# Patient Record
Sex: Female | Born: 1955 | Race: White | Hispanic: No | Marital: Single | Smoking: Former smoker
Health system: Southern US, Community
[De-identification: ages and names within clinical notes are randomized; demographics above are authoritative.]

## PROBLEM LIST (undated history)

## (undated) DIAGNOSIS — E039 Hypothyroidism, unspecified: Secondary | ICD-10-CM

## (undated) DIAGNOSIS — G40909 Epilepsy, unspecified, not intractable, without status epilepticus: Secondary | ICD-10-CM

## (undated) DIAGNOSIS — N390 Urinary tract infection, site not specified: Secondary | ICD-10-CM

## (undated) DIAGNOSIS — F909 Attention-deficit hyperactivity disorder, unspecified type: Secondary | ICD-10-CM

## (undated) DIAGNOSIS — F419 Anxiety disorder, unspecified: Secondary | ICD-10-CM

## (undated) HISTORY — PX: APPENDECTOMY: SHX54

## (undated) HISTORY — PX: NASAL SINUS SURGERY: SHX719

## (undated) HISTORY — PX: BREAST LUMPECTOMY: SHX2

## (undated) HISTORY — PX: BLADDER SURGERY: SHX569

---

## 2006-03-13 ENCOUNTER — Encounter: Admission: RE | Admit: 2006-03-13 | Discharge: 2006-03-13 | Payer: Self-pay | Admitting: Obstetrics and Gynecology

## 2007-04-15 ENCOUNTER — Ambulatory Visit: Payer: Self-pay | Admitting: Internal Medicine

## 2007-04-15 DIAGNOSIS — G253 Myoclonus: Secondary | ICD-10-CM | POA: Insufficient documentation

## 2007-04-15 DIAGNOSIS — E039 Hypothyroidism, unspecified: Secondary | ICD-10-CM | POA: Insufficient documentation

## 2007-04-15 DIAGNOSIS — D803 Selective deficiency of immunoglobulin G [IgG] subclasses: Secondary | ICD-10-CM | POA: Insufficient documentation

## 2007-04-15 DIAGNOSIS — Z8669 Personal history of other diseases of the nervous system and sense organs: Secondary | ICD-10-CM | POA: Insufficient documentation

## 2007-04-15 DIAGNOSIS — F418 Other specified anxiety disorders: Secondary | ICD-10-CM | POA: Insufficient documentation

## 2007-04-16 ENCOUNTER — Telehealth (INDEPENDENT_AMBULATORY_CARE_PROVIDER_SITE_OTHER): Payer: Self-pay | Admitting: *Deleted

## 2007-04-28 LAB — CONVERTED CEMR LAB
Chloride: 104 meq/L (ref 96–112)
Creatinine, Ser: 0.7 mg/dL (ref 0.4–1.2)
Eosinophils Absolute: 0.1 10*3/uL (ref 0.0–0.6)
Eosinophils Relative: 1 % (ref 0.0–5.0)
Glucose, Bld: 88 mg/dL (ref 70–99)
HCT: 41.4 % (ref 36.0–46.0)
Hemoglobin: 14.1 g/dL (ref 12.0–15.0)
MCV: 91.1 fL (ref 78.0–100.0)
Monocytes Absolute: 0.5 10*3/uL (ref 0.2–0.7)
Neutrophils Relative %: 58.8 % (ref 43.0–77.0)
Potassium: 3.8 meq/L (ref 3.5–5.1)
RBC: 4.55 M/uL (ref 3.87–5.11)
RDW: 13.9 % (ref 11.5–14.6)
Sodium: 138 meq/L (ref 135–145)
WBC: 11 10*3/uL — ABNORMAL HIGH (ref 4.5–10.5)

## 2007-05-05 ENCOUNTER — Ambulatory Visit: Payer: Self-pay | Admitting: Internal Medicine

## 2007-05-05 ENCOUNTER — Telehealth (INDEPENDENT_AMBULATORY_CARE_PROVIDER_SITE_OTHER): Payer: Self-pay | Admitting: *Deleted

## 2007-05-23 ENCOUNTER — Encounter (INDEPENDENT_AMBULATORY_CARE_PROVIDER_SITE_OTHER): Payer: Self-pay | Admitting: *Deleted

## 2007-12-01 ENCOUNTER — Telehealth (INDEPENDENT_AMBULATORY_CARE_PROVIDER_SITE_OTHER): Payer: Self-pay | Admitting: *Deleted

## 2008-01-30 ENCOUNTER — Ambulatory Visit: Payer: Self-pay | Admitting: Internal Medicine

## 2008-01-30 DIAGNOSIS — J329 Chronic sinusitis, unspecified: Secondary | ICD-10-CM | POA: Insufficient documentation

## 2008-02-03 LAB — CONVERTED CEMR LAB
Basophils Absolute: 0.1 10*3/uL (ref 0.0–0.1)
Eosinophils Absolute: 0.2 10*3/uL (ref 0.0–0.7)
HCT: 42 % (ref 36.0–46.0)
Hemoglobin: 14.7 g/dL (ref 12.0–15.0)
MCHC: 34.9 g/dL (ref 30.0–36.0)
Monocytes Absolute: 0.5 10*3/uL (ref 0.1–1.0)
Neutro Abs: 4 10*3/uL (ref 1.4–7.7)
RDW: 12.8 % (ref 11.5–14.6)
TSH: 1.59 microintl units/mL (ref 0.35–5.50)

## 2008-02-05 ENCOUNTER — Encounter (INDEPENDENT_AMBULATORY_CARE_PROVIDER_SITE_OTHER): Payer: Self-pay | Admitting: *Deleted

## 2008-02-12 ENCOUNTER — Encounter (INDEPENDENT_AMBULATORY_CARE_PROVIDER_SITE_OTHER): Payer: Self-pay | Admitting: *Deleted

## 2008-02-26 ENCOUNTER — Encounter: Admission: RE | Admit: 2008-02-26 | Discharge: 2008-02-26 | Payer: Self-pay | Admitting: Orthopedic Surgery

## 2008-05-17 ENCOUNTER — Telehealth (INDEPENDENT_AMBULATORY_CARE_PROVIDER_SITE_OTHER): Payer: Self-pay | Admitting: *Deleted

## 2008-06-08 ENCOUNTER — Telehealth (INDEPENDENT_AMBULATORY_CARE_PROVIDER_SITE_OTHER): Payer: Self-pay | Admitting: *Deleted

## 2008-06-14 ENCOUNTER — Telehealth (INDEPENDENT_AMBULATORY_CARE_PROVIDER_SITE_OTHER): Payer: Self-pay | Admitting: *Deleted

## 2008-11-16 ENCOUNTER — Ambulatory Visit: Payer: Self-pay | Admitting: Internal Medicine

## 2008-11-16 DIAGNOSIS — R42 Dizziness and giddiness: Secondary | ICD-10-CM

## 2008-11-16 LAB — CONVERTED CEMR LAB
BUN: 22 mg/dL (ref 6–23)
Basophils Absolute: 0 10*3/uL (ref 0.0–0.1)
Basophils Relative: 0 % (ref 0.0–3.0)
CO2: 28 meq/L (ref 19–32)
Calcium: 9.4 mg/dL (ref 8.4–10.5)
Chloride: 104 meq/L (ref 96–112)
Creatinine, Ser: 0.7 mg/dL (ref 0.4–1.2)
Eosinophils Absolute: 0.3 10*3/uL (ref 0.0–0.7)
Eosinophils Relative: 3.4 % (ref 0.0–5.0)
GFR calc non Af Amer: 93.01 mL/min (ref >60)
Glucose, Bld: 86 mg/dL (ref 70–99)
HCT: 41.1 % (ref 36.0–46.0)
Hemoglobin: 14 g/dL (ref 12.0–15.0)
Lymphocytes Relative: 38.2 % (ref 12.0–46.0)
Lymphs Abs: 3.4 10*3/uL (ref 0.7–4.0)
MCHC: 34.2 g/dL (ref 30.0–36.0)
MCV: 91.7 fL (ref 78.0–100.0)
Monocytes Absolute: 0.1 10*3/uL (ref 0.1–1.0)
Monocytes Relative: 1.5 % — ABNORMAL LOW (ref 3.0–12.0)
Neutro Abs: 5.2 10*3/uL (ref 1.4–7.7)
Neutrophils Relative %: 56.9 % (ref 43.0–77.0)
Platelets: 235 10*3/uL (ref 150.0–400.0)
Potassium: 4.5 meq/L (ref 3.5–5.1)
RBC: 4.48 M/uL (ref 3.87–5.11)
RDW: 13.4 % (ref 11.5–14.6)
Sodium: 140 meq/L (ref 135–145)
TSH: 0.85 microintl units/mL (ref 0.35–5.50)
WBC: 9 10*3/uL (ref 4.5–10.5)

## 2008-11-18 ENCOUNTER — Ambulatory Visit: Payer: Self-pay | Admitting: Internal Medicine

## 2008-11-18 ENCOUNTER — Ambulatory Visit: Payer: Self-pay | Admitting: Diagnostic Radiology

## 2008-11-18 ENCOUNTER — Telehealth (INDEPENDENT_AMBULATORY_CARE_PROVIDER_SITE_OTHER): Payer: Self-pay | Admitting: *Deleted

## 2008-11-18 ENCOUNTER — Ambulatory Visit (HOSPITAL_BASED_OUTPATIENT_CLINIC_OR_DEPARTMENT_OTHER): Admission: RE | Admit: 2008-11-18 | Discharge: 2008-11-18 | Payer: Self-pay | Admitting: Certified Registered"

## 2008-11-18 ENCOUNTER — Ambulatory Visit: Payer: Self-pay | Admitting: Interventional Radiology

## 2008-11-26 ENCOUNTER — Telehealth: Payer: Self-pay | Admitting: Internal Medicine

## 2009-10-17 ENCOUNTER — Telehealth (INDEPENDENT_AMBULATORY_CARE_PROVIDER_SITE_OTHER): Payer: Self-pay | Admitting: *Deleted

## 2009-11-09 ENCOUNTER — Ambulatory Visit: Payer: Self-pay | Admitting: Internal Medicine

## 2009-11-10 ENCOUNTER — Encounter (INDEPENDENT_AMBULATORY_CARE_PROVIDER_SITE_OTHER): Payer: Self-pay | Admitting: *Deleted

## 2009-11-11 ENCOUNTER — Ambulatory Visit: Payer: Self-pay | Admitting: Internal Medicine

## 2009-11-16 LAB — CONVERTED CEMR LAB
ALT: 16 units/L (ref 0–35)
Basophils Relative: 0.7 % (ref 0.0–3.0)
CO2: 29 meq/L (ref 19–32)
Calcium: 9.5 mg/dL (ref 8.4–10.5)
Eosinophils Relative: 1.9 % (ref 0.0–5.0)
HDL: 92 mg/dL (ref >39.00)
Hemoglobin: 13.9 g/dL (ref 12.0–15.0)
Lymphocytes Relative: 40.2 % (ref 12.0–46.0)
MCHC: 34 g/dL (ref 30.0–36.0)
Monocytes Relative: 6.2 % (ref 3.0–12.0)
Neutro Abs: 4.3 10*3/uL (ref 1.4–7.7)
RBC: 4.43 M/uL (ref 3.87–5.11)
Sodium: 141 meq/L (ref 135–145)
Total CHOL/HDL Ratio: 3
Triglycerides: 69 mg/dL (ref 0.0–149.0)
VLDL: 13.8 mg/dL (ref 0.0–40.0)

## 2010-02-03 ENCOUNTER — Telehealth (INDEPENDENT_AMBULATORY_CARE_PROVIDER_SITE_OTHER): Payer: Self-pay | Admitting: *Deleted

## 2010-06-18 ENCOUNTER — Encounter: Payer: Self-pay | Admitting: Internal Medicine

## 2010-06-18 ENCOUNTER — Encounter: Payer: Self-pay | Admitting: Emergency Medicine

## 2010-06-19 ENCOUNTER — Encounter: Payer: Self-pay | Admitting: Internal Medicine

## 2010-06-27 NOTE — Progress Notes (Signed)
Summary: pt retd call -cpx scheduled [id number]  Phone Note Outgoing Call Call back at [facility] Phone [phone] Call back at Work Phone [phone]   Summary of Call: Due cpx/labs.......Marland KitchenShary Decamp  Oct 17, 2009 10:00 AM   Follow-up for Phone Call        [email or web address] Barban  Oct 17, 2009 10:05 AM  Additional Follow-up for Phone Call Additional follow up Details #1::        patient returned call appt scheduled [id number] Additional Follow-up by: Okey Regal Spring,  Oct 17, 2009 11:56 AM

## 2010-06-27 NOTE — Letter (Signed)
Summary: Previsit letter  [facility]  [address] [place], [place] [address]   Phone: [phone]  Fax: [phone]       11/10/2009 MRN: [id number]  [facility] [address] [place], [place]  [address]  Dear Ms. Dareen Piano,  Welcome to the Gastroenterology Division at [facility].    You are scheduled to see a nurse for your pre-procedure visit on 11-25-09 at 1pm on the 3rd floor at [facility], [address]  We ask that you try to arrive at our office 15 minutes prior to your appointment time to allow for check-in.  Your nurse visit will consist of discussing your medical and surgical history, your immediate family medical history, and your medications.    Please bring a complete list of all your medications or, if you prefer, bring the medication bottles and we will list them.  We will need to be aware of both prescribed and over the counter drugs.  We will need to know exact dosage information as well.  If you are on blood thinners (Coumadin, Plavix, Aggrenox, Ticlid, etc.) please call our office today/prior to your appointment, as we need to consult with your physician about holding your medication.   Please be prepared to read and sign documents such as consent forms, a financial agreement, and acknowledgement forms.  If necessary, and with your consent, a friend or relative is welcome to sit-in on the nurse visit with you.  Please bring your insurance card so that we may make a copy of it.  If your insurance requires a referral to see a specialist, please bring your referral form from your primary care physician.  No co-pay is required for this nurse visit.     If you cannot keep your appointment, please call [phone] to cancel or reschedule prior to your appointment date.  This allows [place] the opportunity to schedule an appointment for another patient in need of care.    Thank you for choosing [facility] for your medical  needs.  We appreciate the opportunity to care for you.  Please visit [place] at our website  to learn more about our practice.                     Sincerely.                                                                                                                   The Gastroenterology Division

## 2010-06-27 NOTE — Assessment & Plan Note (Signed)
Summary: cpx/cbs   Vital Signs:  Patient profile:   55 year old female Height:      63 inches Weight:      128 pounds BMI:     22.76 Temp:     98.9 degrees F oral Pulse rate:   82 / minute Resp:     20 per minute BP sitting:   130 / 86  (left arm)  Vitals Entered By: Jeremy Johann CMA (November 09, 2009 2:48 PM) CC: CPX Comments --NOT FASTING --REFILL REVIEWED MED LIST, PATIENT AGREED DOSE AND INSTRUCTION CORRECT    History of Present Illness: CPX -status post  root canal  x 2  complicated by an abscess, she has seen the dentist several times in the last few months, she has taken several antibiotics -she met with Dr. Craige Cotta, a neurologist, she was seen with apparently headache and dizziness. She got a  local injection  in the back of the neck as well as Topamax which she couldn't tolerate -she has seen psychiatrists at [facility], she was put on Celexa which helps with depression. She was diagnosed with ADHD, prescribed Adderall. She thinks Adderall is not  helping at all.   Allergies: 1)  ! Sulfa 2)  ! Pcn 3)  ! * Maxiphen  Past History:  Past Medical History: G1 miscarriage x 1  Hypothyroidism Depression ADHD? (was eval at [facility] 2011) IMMUNOGLOBULIN DEFICIENCIES  .Marland Kitchen. sees ID in [place] h/o SZ d/o and Myoclonus ...sees neurology in [facility] (Dr Craige Cotta)  Past Surgical History: Reviewed history from 04/15/2007 and no changes required. 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 CV:  Denies chest pain or  discomfort and swelling of feet. Resp:  Denies cough and shortness of breath. GI:  Denies bloody stools, nausea, and vomiting. GU:  Denies discharge and dysuria.  Physical Exam  General:  alert, well-developed, and well-nourished.   Neck:  no masses and no thyromegaly.   Lungs:  normal respiratory effort, no intercostal retractions, no accessory muscle use, and normal breath sounds.   Heart:  normal rate, regular rhythm, no murmur, and no gallop.   Abdomen:  soft, non-tender, no distention, no masses, no guarding, and no rigidity.   Extremities:  no edema Neurologic:  alert & oriented X3, cranial nerves II-XII intact, strength normal in all extremities, and gait normal.   Psych:  Cognition and judgment appear intact. Alert and cooperative with normal attention span and concentration. not anxious appearing and not depressed appearing.     Impression & Recommendations:  Problem # 1:  HEALTH SCREENING (ICD-V70.0) Td 2008 EKG-- baseline, NSR has not seen gyn in a while , recommend to schedule her appointment soon as possible mammogram 10/2008, negative. Ordering one several Cscopes before, last > 10 years per patient . She has a family history colon cancer, referred to GI  labs Encouraged to continue with her healthy lifestyle  Orders: Radiology Referral (Radiology) EKG w/ Interpretation (93000) Gastroenterology Referral (GI)  Problem #  2:  HYPOTHYROIDISM (ICD-244.9) labs RF Her updated medication list for this problem includes:    Synthroid 100 Mcg Tabs (Levothyroxine sodium) ..... Once daily - due office visit  Labs Reviewed: TSH: 0.85 (11/16/2008)     Problem # 3:  DIZZINESS (ICD-780.4) see history of present illness The following medications were removed from the medication list:    Meclizine Hcl 12.5 Mg Tabs (Meclizine hcl) ..... One p.o. every 6 hours p.r.n. dizziness  Problem # 4:  DEPRESSION (ICD-311) see history of present illness Her updated medication list for  this problem includes:    Celexa 20 Mg Tabs (Citalopram hydrobromide) .Marland Kitchen... Take 1 tab once daily    Clonazepam 1 Mg Tabs (Clonazepam) ..... Once daily  Complete Medication List: 1)  Celexa 20 Mg Tabs (Citalopram hydrobromide) .... Take 1 tab once daily 2)  Clonazepam 1 Mg Tabs (Clonazepam) .... Once daily 3)  Synthroid 100 Mcg Tabs (Levothyroxine sodium) .... Once daily - due office visit  Patient Instructions: 1)  come back fasting: 2)   BMP, CBC, TSH,FLP, AST, ALT   dx v70 3)  Please schedule a follow-up appointment in 1 year.  Prescriptions: SYNTHROID 100 MCG  TABS (LEVOTHYROXINE SODIUM) once daily - DUE OFFICE VISIT  #30 x 6   Entered and Authorized by:   Nolon Rod. Beth Goodlin MD   Signed by:   Nolon Rod. Voyd Groft MD on 11/09/2009   Method used:   Electronically to        [facility] [phone]* (retail)       [address]       [place], [place]  [address]       Ph: [phone]       Fax: [phone]   RxID:   [id number]

## 2010-06-27 NOTE — Progress Notes (Signed)
Summary: refill  Phone Note Refill Request Message from:  Fax from Pharmacy on February 03, 2010 10:04 AM  Refills Requested: Medication #1:  SYNTHROID 100 MCG  TABS once daily - DUE OFFICE VISIT. Vickey Sages - fax [phone]  Initial call taken by: Okey Regal Spring,  February 03, 2010 10:04 AM    Prescriptions: SYNTHROID 100 MCG  TABS (LEVOTHYROXINE SODIUM) once daily - DUE OFFICE VISIT  #90 x 1   Entered by:   Army Fossa CMA   Authorized by:   Nolon Rod. Paz MD   Signed by:   Army Fossa CMA on 02/03/2010   Method used:   Faxed to ...       [facility] (mail-order)             , [place]         Ph: [phone]       Fax: [phone]   RxID:   [id number]

## 2010-07-14 ENCOUNTER — Ambulatory Visit (INDEPENDENT_AMBULATORY_CARE_PROVIDER_SITE_OTHER): Payer: BC Managed Care – PPO | Admitting: Psychiatry

## 2010-07-14 ENCOUNTER — Ambulatory Visit (HOSPITAL_COMMUNITY): Payer: Self-pay | Admitting: Psychiatry

## 2010-07-14 DIAGNOSIS — F411 Generalized anxiety disorder: Secondary | ICD-10-CM

## 2010-07-14 DIAGNOSIS — F339 Major depressive disorder, recurrent, unspecified: Secondary | ICD-10-CM

## 2010-07-21 ENCOUNTER — Ambulatory Visit (HOSPITAL_COMMUNITY): Payer: Self-pay | Admitting: Psychiatry

## 2010-07-26 ENCOUNTER — Ambulatory Visit (HOSPITAL_COMMUNITY): Payer: BC Managed Care – PPO | Admitting: Psychology

## 2010-09-12 ENCOUNTER — Encounter (INDEPENDENT_AMBULATORY_CARE_PROVIDER_SITE_OTHER): Payer: BLUE CROSS/BLUE SHIELD | Admitting: Psychiatry

## 2010-09-12 DIAGNOSIS — F411 Generalized anxiety disorder: Secondary | ICD-10-CM

## 2010-10-25 ENCOUNTER — Encounter (INDEPENDENT_AMBULATORY_CARE_PROVIDER_SITE_OTHER): Payer: BLUE CROSS/BLUE SHIELD | Admitting: Psychiatry

## 2010-10-25 DIAGNOSIS — F339 Major depressive disorder, recurrent, unspecified: Secondary | ICD-10-CM

## 2010-11-07 ENCOUNTER — Encounter: Payer: Self-pay | Admitting: Emergency Medicine

## 2010-11-07 ENCOUNTER — Inpatient Hospital Stay (INDEPENDENT_AMBULATORY_CARE_PROVIDER_SITE_OTHER)
Admission: RE | Admit: 2010-11-07 | Discharge: 2010-11-07 | Disposition: A | Discharge status: Home / Self Care | Payer: BLUE CROSS/BLUE SHIELD | Source: Ambulatory Visit | Attending: Emergency Medicine | Admitting: Emergency Medicine

## 2010-11-07 ENCOUNTER — Ambulatory Visit (INDEPENDENT_AMBULATORY_CARE_PROVIDER_SITE_OTHER): Payer: BLUE CROSS/BLUE SHIELD | Admitting: Psychology

## 2010-11-07 DIAGNOSIS — F321 Major depressive disorder, single episode, moderate: Secondary | ICD-10-CM

## 2010-11-07 DIAGNOSIS — N390 Urinary tract infection, site not specified: Secondary | ICD-10-CM

## 2010-11-07 LAB — CONVERTED CEMR LAB
Glucose, Urine, Semiquant: NEGATIVE
Nitrite: NEGATIVE
Specific Gravity, Urine: >=1.03
pH: 6.5

## 2010-11-10 ENCOUNTER — Telehealth (INDEPENDENT_AMBULATORY_CARE_PROVIDER_SITE_OTHER): Payer: Self-pay | Admitting: Emergency Medicine

## 2010-11-12 ENCOUNTER — Telehealth (INDEPENDENT_AMBULATORY_CARE_PROVIDER_SITE_OTHER): Payer: Self-pay

## 2010-11-21 ENCOUNTER — Encounter (INDEPENDENT_AMBULATORY_CARE_PROVIDER_SITE_OTHER): Payer: BC Managed Care – PPO | Admitting: Psychology

## 2010-11-21 ENCOUNTER — Encounter: Payer: Self-pay | Admitting: Family Medicine

## 2010-11-21 ENCOUNTER — Inpatient Hospital Stay (INDEPENDENT_AMBULATORY_CARE_PROVIDER_SITE_OTHER)
Admission: RE | Admit: 2010-11-21 | Discharge: 2010-11-21 | Disposition: A | Discharge status: Home / Self Care | Payer: BLUE CROSS/BLUE SHIELD | Source: Ambulatory Visit | Attending: Family Medicine | Admitting: Family Medicine

## 2010-11-21 DIAGNOSIS — F411 Generalized anxiety disorder: Secondary | ICD-10-CM

## 2010-11-21 DIAGNOSIS — N390 Urinary tract infection, site not specified: Secondary | ICD-10-CM

## 2010-11-21 LAB — CONVERTED CEMR LAB
Bilirubin Urine: NEGATIVE
Blood in Urine, dipstick: NEGATIVE
Nitrite: NEGATIVE
Specific Gravity, Urine: 1.025
Urobilinogen, UA: 0.2
pH: 6

## 2010-12-01 ENCOUNTER — Ambulatory Visit (INDEPENDENT_AMBULATORY_CARE_PROVIDER_SITE_OTHER): Payer: BLUE CROSS/BLUE SHIELD | Admitting: Family Medicine

## 2010-12-01 ENCOUNTER — Encounter: Payer: Self-pay | Admitting: Family Medicine

## 2010-12-01 DIAGNOSIS — Z23 Encounter for immunization: Secondary | ICD-10-CM

## 2010-12-01 DIAGNOSIS — G43109 Migraine with aura, not intractable, without status migrainosus: Secondary | ICD-10-CM | POA: Insufficient documentation

## 2010-12-01 DIAGNOSIS — I341 Nonrheumatic mitral (valve) prolapse: Secondary | ICD-10-CM | POA: Insufficient documentation

## 2010-12-01 DIAGNOSIS — N390 Urinary tract infection, site not specified: Secondary | ICD-10-CM

## 2010-12-01 DIAGNOSIS — F329 Major depressive disorder, single episode, unspecified: Secondary | ICD-10-CM

## 2010-12-01 DIAGNOSIS — I059 Rheumatic mitral valve disease, unspecified: Secondary | ICD-10-CM

## 2010-12-01 MED ORDER — ESTROGENS, CONJUGATED 0.625 MG/GM VA CREA: TOPICAL_CREAM | VAGINAL | Status: DC

## 2010-12-01 MED ORDER — ZOSTER VACCINE LIVE 19400 UNT/0.65ML ~~LOC~~ SOLR
0.6500 mL | Freq: Once | SUBCUTANEOUS | Status: AC
  Administered 2010-12-01: 19400 [IU] via SUBCUTANEOUS

## 2010-12-01 MED ORDER — NITROFURANTOIN MACROCRYSTAL 100 MG PO CAPS: 100.0000 mg | ORAL_CAPSULE | Freq: Every day | ORAL | Status: AC

## 2010-12-01 NOTE — Progress Notes (Signed)
Subjective:    Patient ID: Diana Porter, female    DOB: 07-13-1955, 55 y.o.   MRN: [id number]  HPI Had 3 bladder infection around Feb, and before that hadn't had any infections since 2007.  Went to Dr. Sabino Gasser at [facility] and had cystoscopy and CT with contrast because was having so much pain in her bladder.  Culture grew out e coli. Can take several weeks once treated infection for UTI. Used to be on prophylactic levaquin years ago before her bladder surgery.  With the last infection took cipro for 2 week. Went on Roy again and started feeling better again.  Went back to Dr. Sabino Gasser about a week ago. Having hematuria. Hx of Interstitial cystitis. She reports a hx of bladder problems since age 16.  Just completed recent ABX for sinusitis.     Review of Systems  Constitutional: Negative for fever, diaphoresis and unexpected weight change.  HENT: Negative for hearing loss, rhinorrhea and tinnitus.   Eyes: Negative for visual disturbance.  Respiratory: Negative for cough and wheezing.   Cardiovascular: Negative for chest pain and palpitations.  Gastrointestinal: Negative for nausea, vomiting, diarrhea and blood in stool.  Genitourinary: Negative for vaginal bleeding, vaginal discharge and difficulty urinating.  Musculoskeletal: Negative for myalgias and arthralgias.  Skin: Negative for rash.  Neurological: Positive for headaches.  Hematological: Negative for adenopathy. Does not bruise/bleed easily.  Psychiatric/Behavioral: Negative for sleep disturbance and dysphoric mood. The patient is not nervous/anxious.      BP 120/70  Pulse 90  Temp(Src) 98.7 F (37.1 C) (Oral)  Ht 5\' 3"  (1.6 m)  Wt 134 lb (60.782 kg)  BMI 23.74 kg/m2  SpO2 96%    Allergies  Allergen Reactions  . Penicillins   . Sulfonamide Derivatives     No past medical history on file.  Past Surgical History  Procedure Date  . Appendectomy   . Breast lumpectomy     x 2  . Bladder surgery     x 2    . Nasal sinus surgery     History   Social History  . Marital Status: Single    Spouse Name: N/A    Number of Children: N/A  . Years of Education: N/A   Occupational History  . Not on file.   Social History Main Topics  . Smoking status: Former Smoker -- 0.3 packs/day    Types: Cigarettes    Quit date: 05/28/2008  . Smokeless tobacco: Not on file  . Alcohol Use: Yes  . Drug Use: No  . Sexually Active: Not on file   Other Topics Concern  . Not on file   Social History Narrative   Used to do consulting work.  Unemployed. 2 caffeine drinks per day.     Family History  Problem Relation Age of Onset  . Breast cancer Mother 46    metasticized   . Prostate cancer Father   . Prostate cancer Brother 43  . Skin cancer Brother     Current outpatient prescriptions:cefdinir (OMNICEF) 300 MG capsule, Take 300 mg by mouth 2 (two) times daily.  , Disp: , Rfl: ;  citalopram (CELEXA) 20 MG tablet, Take 40 mg by mouth daily.  , Disp: , Rfl: ;  clonazePAM (KLONOPIN) 1 MG tablet, Take 1 mg by mouth 2 (two) times daily as needed.  , Disp: , Rfl: ;  levothyroxine (SYNTHROID, LEVOTHROID) 100 MCG tablet, Take 100 mcg by mouth daily.  , Disp: , Rfl:  topiramate (TOPAMAX) 50  MG tablet, Take 50 mg by mouth 2 (two) times daily.  , Disp: , Rfl: ;  traMADol (ULTRAM) 50 MG tablet, Take 50 mg by mouth 2 (two) times daily as needed.  , Disp: , Rfl: ;  conjugated estrogens (PREMARIN) vaginal cream, Place vaginally once a week., Disp: 42.5 g, Rfl: 6;  nitrofurantoin (MACRODANTIN) 100 MG capsule, Take 1 capsule (100 mg total) by mouth daily., Disp: 30 capsule, Rfl: 6     Objective:   Physical Exam  Constitutional: She is oriented to person, place, and time. She appears well-developed and well-nourished.  HENT:  Head: Normocephalic and atraumatic.  Cardiovascular: Normal rate, regular rhythm and normal heart sounds.   Pulmonary/Chest: Effort normal and breath sounds normal.  Neurological: She is alert  and oriented to person, place, and time.  Skin: Skin is warm and dry.  Psychiatric: She has a normal mood and affect.          Assessment & Plan:

## 2010-12-01 NOTE — Assessment & Plan Note (Signed)
Discussed trial of prophylaxis with nitrofurantoin. She is also 10 years post menopuasal so recommend trial of estrogen cream as well. F/U in 3 mo.

## 2010-12-01 NOTE — Progress Notes (Signed)
Addended by: Gifford Shave on: 12/01/2010 05:17 PM   Modules accepted: Orders

## 2010-12-06 ENCOUNTER — Encounter (INDEPENDENT_AMBULATORY_CARE_PROVIDER_SITE_OTHER): Payer: BLUE CROSS/BLUE SHIELD | Admitting: Psychiatry

## 2010-12-06 DIAGNOSIS — F339 Major depressive disorder, recurrent, unspecified: Secondary | ICD-10-CM

## 2010-12-06 DIAGNOSIS — F411 Generalized anxiety disorder: Secondary | ICD-10-CM

## 2010-12-20 ENCOUNTER — Encounter: Payer: BLUE CROSS/BLUE SHIELD | Admitting: Obstetrics & Gynecology

## 2011-02-06 ENCOUNTER — Encounter (INDEPENDENT_AMBULATORY_CARE_PROVIDER_SITE_OTHER): Payer: BC Managed Care – PPO | Admitting: Psychiatry

## 2011-02-06 DIAGNOSIS — F339 Major depressive disorder, recurrent, unspecified: Secondary | ICD-10-CM

## 2011-03-06 ENCOUNTER — Telehealth: Payer: Self-pay | Admitting: *Deleted

## 2011-03-06 MED ORDER — LEVOTHYROXINE SODIUM 100 MCG PO TABS: 100.0000 ug | ORAL_TABLET | Freq: Every day | ORAL | Status: DC

## 2011-03-06 NOTE — Telephone Encounter (Signed)
Request for Synthroid 100 mcg [last refill 07/10/10 Last OV 11/09/2009].

## 2011-03-06 NOTE — Telephone Encounter (Signed)
Will forward to PCP 

## 2011-03-06 NOTE — Telephone Encounter (Signed)
Call pt. Med sent

## 2011-03-07 NOTE — Telephone Encounter (Signed)
Pt notified. KJ LPN 

## 2011-03-12 ENCOUNTER — Other Ambulatory Visit: Payer: Self-pay | Admitting: Family Medicine

## 2011-03-12 MED ORDER — LEVOTHYROXINE SODIUM 100 MCG PO TABS: 100.0000 ug | ORAL_TABLET | Freq: Every day | ORAL | Status: DC

## 2011-03-12 NOTE — Telephone Encounter (Signed)
Done

## 2011-04-06 ENCOUNTER — Emergency Department
Admission: EM | Admit: 2011-04-06 | Discharge: 2011-04-06 | Disposition: A | Discharge status: Home / Self Care | Payer: BC Managed Care – PPO | Source: Home / Self Care | Attending: Family Medicine | Admitting: Family Medicine

## 2011-04-06 ENCOUNTER — Encounter: Payer: Self-pay | Admitting: *Deleted

## 2011-04-06 DIAGNOSIS — J329 Chronic sinusitis, unspecified: Secondary | ICD-10-CM

## 2011-04-06 DIAGNOSIS — J019 Acute sinusitis, unspecified: Secondary | ICD-10-CM

## 2011-04-06 HISTORY — DX: Epilepsy, unspecified, not intractable, without status epilepticus: G40.909

## 2011-04-06 HISTORY — DX: Anxiety disorder, unspecified: F41.9

## 2011-04-06 HISTORY — DX: Urinary tract infection, site not specified: N39.0

## 2011-04-06 HISTORY — DX: Attention-deficit hyperactivity disorder, unspecified type: F90.9

## 2011-04-06 HISTORY — DX: Hypothyroidism, unspecified: E03.9

## 2011-04-06 MED ORDER — CEFTRIAXONE SODIUM 1 G IJ SOLR
1.0000 g | Freq: Once | INTRAMUSCULAR | Status: AC
  Administered 2011-04-06: 1 g via INTRAMUSCULAR

## 2011-04-06 MED ORDER — LEVOFLOXACIN 750 MG PO TABS: 750.0000 mg | ORAL_TABLET | Freq: Every day | ORAL | Status: AC

## 2011-04-06 MED ORDER — METHYLPREDNISOLONE SODIUM SUCC 125 MG IJ SOLR
125.0000 mg | Freq: Once | INTRAMUSCULAR | Status: AC
  Administered 2011-04-06: 125 mg via INTRAMUSCULAR

## 2011-04-06 MED ORDER — FLUTICASONE PROPIONATE 50 MCG/ACT NA SUSP: 2.0000 | Freq: Every day | NASAL | Status: DC

## 2011-04-06 MED ORDER — FEXOFENADINE-PSEUDOEPHED ER 180-240 MG PO TB24: 1.0000 | ORAL_TABLET | Freq: Every day | ORAL | Status: DC

## 2011-04-06 NOTE — ED Notes (Signed)
Pt c/o sinus pain and pressure, and bilateral ear ache x 1 mth. She states that she "feels bad". She saw Dr Craige Cotta her neurologist on 03/19/11 and was dx with sinusitis and prescribed Cefnidir 300 mg 1 po BID x 10 days and Prednisone with no relief.

## 2011-04-06 NOTE — Discharge Instructions (Signed)

## 2011-04-06 NOTE — ED Provider Notes (Signed)
History     CSN: [id number] Arrival date & time: 04/06/2011 11:02 AM   First MD Initiated Contact with Patient 04/06/11 1102      Chief Complaint  Patient presents with  . Sinusitis    (Consider location/radiation/quality/duration/timing/severity/associated sxs/prior treatment) Patient is a 56 y.o. female presenting with sinusitis.  Sinusitis    Patient's here because of sinus pressure she reports having sinus trouble for over a month she's had 2 previous sinus surgeries. Saw her neurologist who place [place] Omnicef 300 twice a day for 10 days and placed on prednisone several days. Despite these 2 medications she still having trouble and symptoms of sinusitis infection. Patient states she felt miserable can get in to see her PCP today so she came to urgent care to be seen. Past Medical History  Diagnosis Date  . Hypothyroidism   . Chronic UTI   . Epilepsy   . ADD (attention deficit disorder with hyperactivity)   . Anxiety     Past Surgical History  Procedure Date  . Appendectomy   . Breast lumpectomy     x 2  . Bladder surgery     x 2   . Nasal sinus surgery     Family History  Problem Relation Age of Onset  . Breast cancer Mother 17    metasticized   . Prostate cancer Father   . Heart failure Father   . Prostate cancer Brother 39  . Lymphoma Brother   . Skin cancer Brother     History  Substance Use Topics  . Smoking status: Former Smoker -- 0.3 packs/day    Types: Cigarettes    Quit date: 05/28/2008  . Smokeless tobacco: Not on file  . Alcohol Use: Yes     3-5 per wk    OB History    Grav Para Term Preterm Abortions TAB SAB Ect Mult Living                  Review of Systems  Allergies  Guaifenesin & derivatives; Latex; Penicillins; and Sulfonamide derivatives  Home Medications   Current Outpatient Rx  Name Route Sig Dispense Refill  . NITROFURANTOIN MACROCRYSTAL 100 MG PO CAPS Oral Take 100 mg by mouth at bedtime.      Marland Kitchen CEFDINIR 300 MG PO CAPS  Oral Take 300 mg by mouth 2 (two) times daily.      Marland Kitchen CITALOPRAM HYDROBROMIDE 20 MG PO TABS Oral Take 40 mg by mouth daily.      Marland Kitchen CLONAZEPAM 1 MG PO TABS Oral Take 1 mg by mouth 2 (two) times daily as needed.      Marland Kitchen ESTROGENS, CONJUGATED 0.625 MG/GM VA CREA Vaginal Place vaginally once a week. 42.5 g 6  . LEVOTHYROXINE SODIUM 100 MCG PO TABS Oral Take 1 tablet (100 mcg total) by mouth daily. 90 tablet 0  . TOPIRAMATE 50 MG PO TABS Oral Take 50 mg by mouth 2 (two) times daily.      . TRAMADOL HCL 50 MG PO TABS Oral Take 50 mg by mouth 2 (two) times daily as needed.        BP 112/75  Pulse 89  Temp(Src) 99 F (37.2 C) (Oral)  Resp 18  Ht 5' 3.5" (1.613 m)  Wt 136 lb 8 oz (61.916 kg)  BMI 23.80 kg/m2  SpO2 98%  Physical Exam  Constitutional: She is oriented to person, place, and time. She appears well-developed and well-nourished.  HENT:  Head: Normocephalic.  Right Ear:  Hearing, tympanic membrane, external ear and ear canal normal.  Left Ear: Hearing, tympanic membrane and ear canal normal.  Nose: Mucosal edema present. No rhinorrhea. Right sinus exhibits maxillary sinus tenderness. Left sinus exhibits maxillary sinus tenderness.  Mouth/Throat: Uvula is midline, oropharynx is clear and moist and mucous membranes are normal. Normal dentition. No dental caries.       Tenderness over R eustachian tube  Neck: Trachea normal and normal range of motion. Neck supple.  Neurological: She is alert and oriented to person, place, and time.  Skin: Skin is warm and dry.  Psychiatric: She has a normal mood and affect. Her behavior is normal.    ED Course  Procedures (including critical care time) 6 since patient has a penicillin allergy we'll place her on Levaquin 750 one tablet a day for at least 10 days #2 Allegra-D 1 tablet 24 hours daily #3 Flonase nasal spray went over how to use the steroid nasal spray and follow up with ENT if is not better in 10-14 days.       MDM           Hassan Rowan, MD 04/06/11 1556

## 2011-04-16 ENCOUNTER — Ambulatory Visit (INDEPENDENT_AMBULATORY_CARE_PROVIDER_SITE_OTHER): Payer: BC Managed Care – PPO | Admitting: Family Medicine

## 2011-04-16 ENCOUNTER — Encounter: Payer: Self-pay | Admitting: Family Medicine

## 2011-04-16 VITALS — BP 125/72 | HR 92 | Temp 99.1°F | Wt 139.0 lb

## 2011-04-16 DIAGNOSIS — D803 Selective deficiency of immunoglobulin G [IgG] subclasses: Secondary | ICD-10-CM

## 2011-04-16 DIAGNOSIS — R894 Abnormal immunological findings in specimens from other organs, systems and tissues: Secondary | ICD-10-CM

## 2011-04-16 DIAGNOSIS — J329 Chronic sinusitis, unspecified: Secondary | ICD-10-CM

## 2011-04-16 DIAGNOSIS — N390 Urinary tract infection, site not specified: Secondary | ICD-10-CM

## 2011-04-16 MED ORDER — LEVOFLOXACIN 750 MG PO TABS: 750.0000 mg | ORAL_TABLET | Freq: Every day | ORAL | Status: AC

## 2011-04-16 NOTE — Progress Notes (Signed)
  Subjective:    Patient ID: Diana Porter, female    DOB: [birth year], 55 y.o.   MRN: [id number]  HPI Has had a sinus infection for > 1 mo.  Given omnicef and prednisone by her neurologist. Diana Porter that for a week and wasn't getting better and then went to UC and Saw Dr. Helane Porter.  Given Levaquin 750mg  for 10 days. Finished that yesterday. Given rocephin and steroid shot.  Feels some better as of yesterday.  Was extremely fatigued.  Still has low grade fevers. Hx of chronic sinusitis.  Stil has sig pressure and tendernesss over the maxillaries and behind the eyes. She has a history of IgG subclass deficiency. She has required several transfusions in the past when she lived in [place]. Her last infusion was several years ago right before she moved to [place].   Review of Systems     Objective:   Physical Exam  Constitutional: She is oriented to person, place, and time. She appears well-developed and well-nourished.  HENT:  Head: Normocephalic and atraumatic.  Right Ear: External ear normal.  Left Ear: External ear normal.  Nose: Nose normal.  Mouth/Throat: Oropharynx is clear and moist.       TMs and canals are clear.   Eyes: Conjunctivae and EOM are normal. Pupils are equal, round, and reactive to light.  Neck: Neck supple. No thyromegaly present.  Cardiovascular: Normal rate, regular rhythm and normal heart sounds.   Pulmonary/Chest: Effort normal and breath sounds normal. She has no wheezes.  Lymphadenopathy:    She has no cervical adenopathy.  Neurological: She is alert and oriented to person, place, and time.  Skin: Skin is warm and dry.  Psychiatric: She has a normal mood and affect.          Assessment & Plan:  Sinusitis - Will extend levaquin for 4 more days.  If she is not better at the end of the antibiotics and please call our office. She did get me a copy of some labs drawn in 2002 where she had several lower levels of class IgG. We'll recheck her IgG, IgA and IgM today.  I did ask them to run the subclasses of IgG as well. She has required several immunoglobulin transfusions in the past to help her when she has difficulty fighting infections. Hopefully she will recover from a sinus infection but if not then we will need to get her in with an infectious disease physician. I also updated her problem list today.

## 2011-04-16 NOTE — Patient Instructions (Signed)
Call if not better by Monday  

## 2011-04-18 ENCOUNTER — Other Ambulatory Visit: Payer: Self-pay | Admitting: Family Medicine

## 2011-04-19 LAB — IGG 1, 2, 3, AND 4
IgG Subclass 2: 92.3 mg/dL — ABNORMAL LOW (ref 169.0–786.0)
IgG Subclass 3: 12 mg/dL (ref 11.0–85.0)
IgG Total IGGSUB: 548 mg/dL — ABNORMAL LOW (ref 690–1700)

## 2011-04-25 ENCOUNTER — Ambulatory Visit (INDEPENDENT_AMBULATORY_CARE_PROVIDER_SITE_OTHER): Payer: BC Managed Care – PPO | Admitting: Family Medicine

## 2011-04-25 ENCOUNTER — Encounter: Payer: Self-pay | Admitting: Family Medicine

## 2011-04-25 VITALS — BP 122/61 | HR 77 | Wt 142.0 lb

## 2011-04-25 DIAGNOSIS — J329 Chronic sinusitis, unspecified: Secondary | ICD-10-CM

## 2011-04-25 DIAGNOSIS — D803 Selective deficiency of immunoglobulin G [IgG] subclasses: Secondary | ICD-10-CM

## 2011-04-25 DIAGNOSIS — D809 Immunodeficiency with predominantly antibody defects, unspecified: Secondary | ICD-10-CM

## 2011-04-25 MED ORDER — LEVOFLOXACIN 750 MG PO TABS: 750.0000 mg | ORAL_TABLET | Freq: Every day | ORAL | Status: AC

## 2011-04-25 NOTE — Progress Notes (Signed)
  Subjective:    Patient ID: Diana Porter, female    DOB: 09-24-1955, 55 y.o.   MRN: [id number]  HPI  She is here to followup on her sinusitis. Please see previous note from the 19th. She feels her symptoms are still persisting that she feels slightly better as of yesterday and today. But she is definitely not 100% better. She occurred he had 10 days of Levaquin for 4 sinusitis. When I saw her on the 19th we extended this for 4 more days. She should have completed this about 6 days ago but says that she completed her antibiotic about 4 days ago. I also reviewed her lab with her where she has low IgG G. She would like me to go ahead and refer her to an infectious disease specialist. She has had to have IgG infusions in the past.  Review of Systems     Objective:   Physical Exam  Constitutional: She is oriented to person, place, and time. She appears well-developed and well-nourished.  HENT:  Head: Normocephalic and atraumatic.  Right Ear: External ear normal.  Left Ear: External ear normal.  Nose: Nose normal.  Mouth/Throat: Oropharynx is clear and moist.       TMs and canals are clear.   Eyes: Conjunctivae and EOM are normal. Pupils are equal, round, and reactive to light.  Neck: Neck supple. No thyromegaly present.  Cardiovascular: Normal rate, regular rhythm and normal heart sounds.   Pulmonary/Chest: Effort normal and breath sounds normal. She has no wheezes.  Lymphadenopathy:    She has no cervical adenopathy.  Neurological: She is alert and oriented to person, place, and time.  Skin: Skin is warm and dry.  Psychiatric: She has a normal mood and affect.          Assessment & Plan:  Persistent sinusitis-I will refill the Levaquin for 7 more days. If at that point she's not significantly improved then refer to ENT for further evaluation. She is already on Flonase.  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

## 2011-04-25 NOTE — Patient Instructions (Signed)
Call if not better in one week.  

## 2011-04-27 ENCOUNTER — Telehealth: Payer: Self-pay | Admitting: *Deleted

## 2011-04-27 NOTE — Telephone Encounter (Signed)
I don't know yet. We have to get records first. We can give her the name of the clinic Victorino Dike called if she i wanting to google them.

## 2011-04-27 NOTE — Telephone Encounter (Signed)
Pt wants to know name of infectious disease MD. York Spaniel you would give her a name since hers is in [place].

## 2011-04-27 NOTE — Telephone Encounter (Signed)
Pt notified and instructed needs to call them again and have them send records as we have not gootten them yet.

## 2011-04-30 NOTE — Progress Notes (Signed)
Summary: f/u bladder infx   Vital Signs:  Patient Profile:   55 Years Old Female CC:      dysuria Height:     63 inches Weight:      134 pounds O2 Sat:      98 % O2 treatment:    Room Air Temp:     99.3 degrees F oral Pulse rate:   84 / minute Resp:     14 per minute BP sitting:   132 / 83  (left arm) Cuff size:   regular  Vitals Entered By: Lajean Saver RN (November 21, 2010 12:19 PM)                  Updated Prior Medication List: CELEXA 20 MG TABS (CITALOPRAM HYDROBROMIDE) Take 1 TAB once daily CLONAZEPAM 1 MG  TABS (CLONAZEPAM) once daily SYNTHROID 100 MCG  TABS (LEVOTHYROXINE SODIUM) once daily  Current Allergies (reviewed today): ! SULFA ! PCN ! * MAXIPHENHistory of Present Illness Chief Complaint: dysuria History of Present Illness:  Subjective:  Patient reports that she finished her Omnicef 3 days ago, and states that her urinary symptoms are almost resolved (but not quite).  She still has some mild dysuria.  She states that she normally needs to take an antibiotic for two weeks.  No vaginal discharge.  No nausea/vomiting.  No fevers, chills, and sweats.  She also admits that she has had 3 UTI's since March.  REVIEW OF SYSTEMS Constitutional Symptoms      Denies fever, chills, night sweats, weight loss, weight gain, and fatigue.  Eyes       Denies change in vision, eye pain, eye discharge, glasses, contact lenses, and eye surgery. Ear/Nose/Throat/Mouth       Denies hearing loss/aids, change in hearing, ear pain, ear discharge, dizziness, frequent runny nose, frequent nose bleeds, sinus problems, sore throat, hoarseness, and tooth pain or bleeding.  Respiratory       Denies dry cough, productive cough, wheezing, shortness of breath, asthma, bronchitis, and emphysema/COPD.  Cardiovascular       Denies murmurs, chest pain, and tires easily with exhertion.    Gastrointestinal       Denies stomach pain, nausea/vomiting, diarrhea, constipation, blood in bowel  movements, and indigestion. Genitourniary       Complains of painful urination.      Denies blood or discharge from vagina, kidney stones, and loss of urinary control.      Comments: hematuira Neurological       Denies paralysis, seizures, and fainting/blackouts. Musculoskeletal       Denies muscle pain, joint pain, joint stiffness, decreased range of motion, redness, swelling, muscle weakness, and gout.  Skin       Denies bruising, unusual mles/lumps or sores, and hair/skin or nail changes.  Psych       Denies mood changes, temper/anger issues, anxiety/stress, speech problems, depression, and sleep problems. Other Comments: Patinet was swithched from sipro to Tripp. She took it for 7 days. She is still symptomatic, although a little imporved. She feels like she needs a longer dose of  Omnicef, given her history of frequent bladder infections   Past History:  Past Medical History: Reviewed history from 11/09/2009 and no changes required. G1 miscarriage x 1  Hypothyroidism Depression ADHD? (was eval at [facility] 2011) IMMUNOGLOBULIN DEFICIENCIES  .Marland Kitchen. sees ID in [place] h/o SZ d/o and Myoclonus ...sees neurology in [place] and [facility] (Dr Craige Cotta)  Past Surgical History: Reviewed history from 04/15/2007 and no  changes required. bladder surgery x 3 Appendectomy lumpectomy x 2 (rt breast) sinus surgery x2  Family History: Reviewed history from 11/09/2009 and no changes required. CHF - M CAD - F Breast Ca - M Bone Ca - M Prostate Ca- F, B colon ca--  GF  leukemia-- B skin ca--B  Social History: Reviewed history from 11/09/2009 and no changes required. 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 urinalysis  (dipstick):  Negative. Assessment PREVIOUS URINE CULTURE REVEALED E. COLI (15,000 COL/ML) SENSITIVE TO ALL.  SUSPECT RECURRING UTI'S A RESULT OF POST-MENOPAUSAL CHANGE IN VAGINAL FLORA  Plan New Medications/Changes: TRAMADOL HCL 50 MG TABS (TRAMADOL HCL) One by mouth q6r as needed pain  #20 x 0, 11/21/2010, Donna Christen MD CEFDINIR 300 MG CAPS (CEFDINIR) 1 by mouth q12hr  #14 x 0, 11/21/2010, Donna Christen MD  New Orders: Urinalysis [CPT-81003] Est. Patient Level III [16109] Planning Comments:   Continue Omnicef for one more week.  Tramadol for pain.  Continue increased fluid intake. Will refer to [facility] for gyn follow-up   The patient and/or caregiver has been counseled thoroughly with regard to medications prescribed including dosage, schedule, interactions, rationale for use, and possible side effects and they verbalize understanding.  Diagnoses and expected course of recovery discussed and will return if not improved as expected or if the condition worsens. Patient and/or caregiver verbalized understanding.  Prescriptions: TRAMADOL HCL 50 MG TABS (TRAMADOL HCL) One by mouth q6r as needed pain  #20 x 0   Entered and Authorized by:   Donna Christen MD   Signed by:   Donna Christen MD on 11/21/2010   Method used:   Print then Give to Patient   RxID:   [id number] CEFDINIR 300 MG CAPS (CEFDINIR) 1 by mouth q12hr  #14 x 0   Entered and Authorized by:   Donna Christen MD   Signed by:   Donna Christen MD on 11/21/2010   Method used:   Print then Give to Patient   RxID:   [id number]   Orders Added: 1)  Urinalysis [CPT-81003] 2)  Est. Patient Level III [29528]    Laboratory Results   Urine Tests  Date/Time Received: November 21, 2010 12:37 PM  Date/Time Reported: November 21, 2010 12:37 PM   Routine Urinalysis   Color: yellow Appearance: Clear Glucose: negative   (Normal Range: Negative) Bilirubin: negative   (Normal Range: Negative) Ketone: 1+   (Normal  Range: Negative) Spec. Gravity: 1.025   (Normal Range: 1.003-1.035) Blood: negative   (Normal Range: Negative) pH: 6.0   (Normal Range: 5.0-8.0) Protein: negative   (Normal Range: Negative) Urobilinogen: 0.2   (Normal Range: 0-1) Nitrite: negative   (Normal Range: Negative) Leukocyte Esterace: negative   (Normal Range: Negative)

## 2011-04-30 NOTE — Telephone Encounter (Signed)
  Phone Note Call from Patient   Caller: Patient Summary of Call: Patient called to state she is still uncomfortable in bladder area despite starting Cipro on Tuesday, which is appropriate per C&S results; and has completed 2 day rx of Pyridium. Requests consult about further improvement. Initial call taken by: Lavell Islam RN,  November 10, 2010 10:10 AM    New/Updated Medications: PYRIDIUM 200 MG TABS (PHENAZOPYRIDINE HCL) 1 by mouth three times a day pc Prescriptions: PYRIDIUM 200 MG TABS (PHENAZOPYRIDINE HCL) 1 by mouth three times a day pc  #6 x 0   Entered and Authorized by:   Donna Christen MD   Signed by:   Donna Christen MD on 11/10/2010   Method used:   [facility] to        [phone][facility] (retail)       [address]       [place], [place]  [address]       Ph: [phone]       Fax: [phone]   RxID:   [id number]  Follow-up with urologist if not improving. Donna Christen MD  November 10, 2010 10:23 AM

## 2011-04-30 NOTE — Progress Notes (Signed)
Summary: ? uti rm 4   Vital Signs:  Patient Profile:   55 Years Old Female CC:      painful urination x 1 day Height:     63 inches Weight:      131.75 pounds O2 Sat:      100 % O2 treatment:    Room Air Temp:     98.7 degrees F oral Pulse rate:   80 / minute Resp:     18 per minute BP sitting:   126 / 77  (left arm) Cuff size:   regular  Vitals Entered By: Clemens Catholic LPN (November 07, 2010 12:10 PM)                  Updated Prior Medication List: CELEXA 20 MG TABS (CITALOPRAM HYDROBROMIDE) Take 1 TAB once daily CLONAZEPAM 1 MG  TABS (CLONAZEPAM) once daily SYNTHROID 100 MCG  TABS (LEVOTHYROXINE SODIUM) once daily  Current Allergies (reviewed today): ! SULFA ! PCN ! * MAXIPHENHistory of Present Illness History from: patient Chief Complaint: painful urination x 1 day History of Present Illness: 55 Years Old Female complains of UTI symptoms for 1 days.  She describes the pain as burning during urination.  She has been using Cystex and Uristat.  Her urine has been green from the meds.  Her last UTI was 2 months ago. She had bladder surgery 5 yrs ago. + dysuria + frequency + urgency No hematuria No vaginal discharge No fever/chills +lower abdomenal pain No back pain No fatigue   REVIEW OF SYSTEMS Constitutional Symptoms      Denies fever, chills, night sweats, weight loss, weight gain, and fatigue.  Eyes       Denies change in vision, eye pain, eye discharge, glasses, contact lenses, and eye surgery. Ear/Nose/Throat/Mouth       Denies hearing loss/aids, change in hearing, ear pain, ear discharge, dizziness, frequent runny nose, frequent nose bleeds, sinus problems, sore throat, hoarseness, and tooth pain or bleeding.  Respiratory       Denies dry cough, productive cough, wheezing, shortness of breath, asthma, bronchitis, and emphysema/COPD.  Cardiovascular       Denies murmurs, chest pain, and tires easily with exhertion.    Gastrointestinal       Denies  stomach pain, nausea/vomiting, diarrhea, constipation, blood in bowel movements, and indigestion. Genitourniary       Complains of painful urination and blood or discharge from vagina.      Denies kidney stones and loss of urinary control. Neurological       Denies paralysis, seizures, and fainting/blackouts. Musculoskeletal       Denies muscle pain, joint pain, joint stiffness, decreased range of motion, redness, swelling, muscle weakness, and gout.  Skin       Denies bruising, unusual mles/lumps or sores, and hair/skin or nail changes.  Psych       Denies mood changes, temper/anger issues, anxiety/stress, speech problems, depression, and sleep problems. Other Comments: pt c/o painful urination and urgency x 1 day. no fever.  she has taken cystex and urostat with no relief.   Past History:  Past Medical History: Reviewed history from 11/09/2009 and no changes required. G1 miscarriage x 1  Hypothyroidism Depression ADHD? (was eval at [facility] 2011) IMMUNOGLOBULIN DEFICIENCIES  .Marland Kitchen. sees ID in [place] h/o SZ d/o and Myoclonus ...sees neurology in [place] and [facility] (Dr Craige Cotta)  Past Surgical History: Reviewed history from 04/15/2007 and no changes required. bladder surgery x 3 Appendectomy lumpectomy x  2 (rt breast) sinus surgery x2  Family History: Reviewed history from 11/09/2009 and no changes required. CHF - M CAD - F Breast Ca - M Bone Ca - M Prostate Ca- F, B colon ca--  GF  leukemia-- B skin ca--B  Social History: Reviewed history from 11/09/2009 and no changes required. 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) tobacco-- quit aprox 2010 ETOH--  socially diet and exercise very good , has lost several pounds  Physical Exam General appearance: well developed, well nourished, no acute distress Chest/Lungs: no rales, wheezes, or rhonchi bilateral, breath sounds equal without effort Heart: regular rate and   rhythm, no murmur Abdomen: soft, non-tender without obvious organomegaly Back: no CVA or flank tenderness MSE: oriented to time, place, and person Assessment New Problems: URINARY TRACT INFECTION (ICD-599.0)   Plan New Medications/Changes: PHENAZOPYRIDINE HCL 200 MG TABS (PHENAZOPYRIDINE HCL) 1 by mouth three times a day for 2 days  #6 x 0, 11/07/2010, Hoyt Koch MD CIPROFLOXACIN HCL 250 MG TABS (CIPROFLOXACIN HCL) 1 by mouth two times a day for 1 week  #14 x 0, 11/07/2010, Hoyt Koch MD  New Orders: New Patient Level III [phone] UA Dipstick w/o Micro (automated)  [81003] T-Culture, Urine [[id number]] Planning Comments:   Increase hydration.  Urine culture pending.  Take meds as directed.  Follow-up with your primary care physician if not improving or if getting worse.   The patient and/or caregiver has been counseled thoroughly with regard to medications prescribed including dosage, schedule, interactions, rationale for use, and possible side effects and they verbalize understanding.  Diagnoses and expected course of recovery discussed and will return if not improved as expected or if the condition worsens. Patient and/or caregiver verbalized understanding.  Prescriptions: PHENAZOPYRIDINE HCL 200 MG TABS (PHENAZOPYRIDINE HCL) 1 by mouth three times a day for 2 days  #6 x 0   Entered and Authorized by:   Hoyt Koch MD   Signed by:   Hoyt Koch MD on 11/07/2010   Method used:   Print then Give to Patient   RxID:   [id number] CIPROFLOXACIN HCL 250 MG TABS (CIPROFLOXACIN HCL) 1 by mouth two times a day for 1 week  #14 x 0   Entered and Authorized by:   Hoyt Koch MD   Signed by:   Hoyt Koch MD on 11/07/2010   Method used:   Print then Give to Patient   RxID:   [id number]   Orders Added: 1)  New Patient Level III [40102] 2)  UA Dipstick w/o Micro (automated)  [81003] 3)  T-Culture, Urine [[id number]]    Laboratory  Results   Urine Tests  Date/Time Received: November 07, 2010 12:27 PM  Date/Time Reported: November 07, 2010 12:27 PM   Routine Urinalysis   Color: green Appearance: Clear Glucose: negative   (Normal Range: Negative) Bilirubin: 3+   (Normal Range: Negative) Ketone: 1+   (Normal Range: Negative) Spec. Gravity: >=1.030   (Normal Range: 1.003-1.035) Blood: 2+   (Normal Range: Negative) pH: 6.5   (Normal Range: 5.0-8.0) Protein: 2+   (Normal Range: Negative) Urobilinogen: 0.2   (Normal Range: 0-1) Nitrite: negative   (Normal Range: Negative) Leukocyte Esterace: trace   (Normal Range: Negative)

## 2011-04-30 NOTE — Telephone Encounter (Signed)
  Phone Note Call from Patient   Caller: Patient Reason for Call: Talk to Nurse Complaint: Urinary/GYN Problems Action Taken: Rx Called In Details for Reason: pt completed ABT for UTI, urology appt is not until Friday requesting a different ABT and pain meds. state pyridum is not working Details of Complaint: cont with increase pain/discomfort Details of Action Taken: spoke with Dr. Thurmond Butts [phone], see below Summary of Call: Onnicef 300mg  by mouth twice a day x 1week #14 and tramadol 50mg  q6hrs as needed-pain #20, spoke with [facility]. Initial call taken by: Linton Flemings RN,  November 12, 2010 4:11 PM

## 2011-05-04 ENCOUNTER — Encounter (HOSPITAL_COMMUNITY): Payer: Self-pay

## 2011-05-08 ENCOUNTER — Encounter (HOSPITAL_COMMUNITY): Payer: BC Managed Care – PPO | Admitting: Psychiatry

## 2011-07-02 ENCOUNTER — Other Ambulatory Visit: Payer: Self-pay | Admitting: Family Medicine

## 2011-07-03 ENCOUNTER — Other Ambulatory Visit: Payer: Self-pay | Admitting: Family Medicine

## 2011-08-31 ENCOUNTER — Ambulatory Visit (INDEPENDENT_AMBULATORY_CARE_PROVIDER_SITE_OTHER): Payer: BC Managed Care – PPO | Admitting: Family Medicine

## 2011-08-31 ENCOUNTER — Encounter: Payer: Self-pay | Admitting: Family Medicine

## 2011-08-31 VITALS — BP 121/69 | HR 96 | Temp 98.6°F | Wt 147.0 lb

## 2011-08-31 DIAGNOSIS — E039 Hypothyroidism, unspecified: Secondary | ICD-10-CM

## 2011-08-31 DIAGNOSIS — Z1231 Encounter for screening mammogram for malignant neoplasm of breast: Secondary | ICD-10-CM

## 2011-08-31 DIAGNOSIS — J329 Chronic sinusitis, unspecified: Secondary | ICD-10-CM

## 2011-08-31 MED ORDER — METHYLPREDNISOLONE ACETATE 80 MG/ML IJ SUSP
80.0000 mg | Freq: Once | INTRAMUSCULAR | Status: AC
  Administered 2011-08-31: 80 mg via INTRAMUSCULAR

## 2011-08-31 MED ORDER — LEVOFLOXACIN 750 MG PO TABS: 750.0000 mg | ORAL_TABLET | Freq: Every day | ORAL | Status: AC

## 2011-08-31 MED ORDER — ESTROGENS, CONJUGATED 0.625 MG/GM VA CREA: TOPICAL_CREAM | VAGINAL | Status: DC

## 2011-08-31 NOTE — Progress Notes (Signed)
  Subjective:  She says the nitrofurantoin for UTI prophylaxis has really helped her.  Patient ID: Diana Porter, female    DOB: [birth year], 56 y.o.   MRN: [id number]  HPI Has had several illnessess since before Christmas. Has been getting gamm globulin.  Getting 4th tx next week.  Still feels "rotten", fatigued.  Feels bilateral maxillary sinuses are swollen.  No fever.  Mild ST. No ear paon or pressure.  Mild post nasal drip.  No recent ABX since December.  Has been worse the last 2 weeks.  She would like a shot of cortisone for relief. She was very adamant that Levaquin is the only drug that works for her sinus infections.  Hypothyroidism-Says feels like her thyroid is off.  Gained weight with very little eating.  She says she has been taking her medication consistently. She has also been very inactive which could also explain her weight gain.  She needs a refill on her premarin.   She says the nitrofurantoin for UTI prophylaxis has really helped her. In  Review of Systems     Objective:   Physical Exam  Constitutional: She is oriented to person, place, and time. She appears well-developed and well-nourished.  HENT:  Head: Normocephalic and atraumatic.  Right Ear: External ear normal.  Left Ear: External ear normal.  Nose: Nose normal.  Mouth/Throat: Oropharynx is clear and moist.       TMs and canals are clear. Tender over the maxillary sinuses bilaterally.   Eyes: Conjunctivae and EOM are normal. Pupils are equal, round, and reactive to light.  Neck: Neck supple. No thyromegaly present.  Cardiovascular: Normal rate, regular rhythm and normal heart sounds.   Pulmonary/Chest: Effort normal and breath sounds normal. She has no wheezes.  Lymphadenopathy:    She has no cervical adenopathy.  Neurological: She is alert and oriented to person, place, and time.  Skin: Skin is warm and dry.  Psychiatric: She has a normal mood and affect.          Assessment & Plan:  Sinusitis -  likely bacterial since has had sxs for several weeks.  She says that the only medication that works well for her as Levaquin. Levaquin 750 mg daily x5 days. She's not significantly better in one week to please call the office. I did discuss with her the importance of considering a medication such as doxycycline in the future because this will hopefully help reduce resistance.  Depo-Medrol 80 mg IM given for acute relief. If she feels she needs a longer course of Levaquin then please call the office back.  Hypothyroidism-recheck TSH since she feels that her thyroid is off a little bit. She says she been taking her medication consistently. She's on either micrograms daily.  She is due for mammogram. Will put in a referral and I will contact her same.  Hormone replacement therapy-I did refill her Premarin cream.

## 2011-08-31 NOTE — Patient Instructions (Signed)

## 2011-09-01 LAB — T4, FREE: Free T4: 1.7 ng/dL (ref 0.80–1.80)

## 2011-09-01 LAB — T3, FREE: T3, Free: 3.8 pg/mL (ref 2.3–4.2)

## 2011-09-01 LAB — TSH: TSH: 0.086 u[IU]/mL — ABNORMAL LOW (ref 0.350–4.500)

## 2011-09-03 ENCOUNTER — Other Ambulatory Visit: Payer: Self-pay | Admitting: Family Medicine

## 2011-09-03 MED ORDER — LEVOTHYROXINE SODIUM 88 MCG PO TABS: 88.0000 ug | ORAL_TABLET | Freq: Every day | ORAL | Status: DC

## 2011-11-01 ENCOUNTER — Encounter: Payer: Self-pay | Admitting: Family Medicine

## 2011-11-01 ENCOUNTER — Ambulatory Visit (INDEPENDENT_AMBULATORY_CARE_PROVIDER_SITE_OTHER): Payer: BC Managed Care – PPO | Admitting: Family Medicine

## 2011-11-01 VITALS — BP 126/70 | HR 108 | Ht 63.0 in | Wt 148.0 lb

## 2011-11-01 DIAGNOSIS — J329 Chronic sinusitis, unspecified: Secondary | ICD-10-CM

## 2011-11-01 DIAGNOSIS — E039 Hypothyroidism, unspecified: Secondary | ICD-10-CM

## 2011-11-01 DIAGNOSIS — L989 Disorder of the skin and subcutaneous tissue, unspecified: Secondary | ICD-10-CM

## 2011-11-01 DIAGNOSIS — D803 Selective deficiency of immunoglobulin G [IgG] subclasses: Secondary | ICD-10-CM

## 2011-11-01 DIAGNOSIS — J3489 Other specified disorders of nose and nasal sinuses: Secondary | ICD-10-CM

## 2011-11-01 MED ORDER — ESTROGENS, CONJUGATED 0.625 MG/GM VA CREA: TOPICAL_CREAM | VAGINAL | Status: DC

## 2011-11-01 MED ORDER — NITROFURANTOIN MACROCRYSTAL 100 MG PO CAPS: 100.0000 mg | ORAL_CAPSULE | Freq: Every day | ORAL | Status: DC

## 2011-11-01 MED ORDER — MUPIROCIN 2 % EX OINT: TOPICAL_OINTMENT | Freq: Two times a day (BID) | CUTANEOUS | Status: DC

## 2011-11-01 NOTE — Progress Notes (Signed)
  Subjective:    Patient ID: Diana Porter, female    DOB: 02-Dec-1955, 56 y.o.   MRN: [id number]  HPI Sinusitis  - She had to have 2 rounds of Levaquin for this. Did eventually see ENT. Had a culture that was negative. Had allergy testing and was negative.  Also did a sinus Ct in 2011.  Has a tender spot on the left side of her nose that has been there for months.  Prior hx of sinus surgery.    IgG deficiency -Getting monthly infeciton.    Skin sores on her arms.  Says they started whileon last round of Levaquin. Off of ABX for last 2 weeks.     Review of Systems     Objective:   Physical Exam  Constitutional: She is oriented to person, place, and time. She appears well-developed and well-nourished.  HENT:  Head: Normocephalic and atraumatic.  Right Ear: External ear normal.  Left Ear: External ear normal.  Nose: Nose normal.  Mouth/Throat: Oropharynx is clear and moist.       TMs and canals are clear.   Eyes: Conjunctivae and EOM are normal. Pupils are equal, round, and reactive to light.  Neck: Neck supple. No thyromegaly present.  Cardiovascular: Normal rate, regular rhythm and normal heart sounds.   Pulmonary/Chest: Effort normal and breath sounds normal. She has no wheezes.  Lymphadenopathy:    She has no cervical adenopathy.  Neurological: She is alert and oriented to person, place, and time.  Skin: Skin is warm and dry.  Psychiatric: She has a normal mood and affect.          Assessment & Plan:  Sinusitis - Resolved acute but still having pain and problem.  Will order a sinus CT for further evaluation. Has had sinusitis on and off since December.    Skin lesions- Will apply bactroban ointment and keep them wrapped with gauze and an ACE wrap. No evidence of cellulits right now. Call if any sign of infection.   IgG deficiency - Monthly infusoin.    Hypothyroidism-recheck thyroid and T3 and T4. We adjusted her dose 8 weeks ago. She's tolerating it well and is currently  asymptomatic.

## 2011-11-01 NOTE — Patient Instructions (Signed)
We will call you with the radiology appointment.

## 2011-11-02 ENCOUNTER — Other Ambulatory Visit: Payer: Self-pay | Admitting: *Deleted

## 2011-11-02 LAB — TSH: TSH: 0.37 u[IU]/mL (ref 0.350–4.500)

## 2011-11-02 LAB — T4, FREE: Free T4: 1.76 ng/dL (ref 0.80–1.80)

## 2011-11-02 MED ORDER — LEVOTHYROXINE SODIUM 88 MCG PO TABS: 88.0000 ug | ORAL_TABLET | Freq: Every day | ORAL | Status: DC

## 2011-11-02 NOTE — Progress Notes (Signed)
Quick Note:  All labs are normal. ______ 

## 2011-11-05 ENCOUNTER — Other Ambulatory Visit: Payer: Self-pay | Admitting: *Deleted

## 2011-11-05 ENCOUNTER — Ambulatory Visit
Admission: RE | Admit: 2011-11-05 | Discharge: 2011-11-05 | Disposition: A | Discharge status: Home / Self Care | Payer: BC Managed Care – PPO | Source: Ambulatory Visit | Attending: Family Medicine | Admitting: Family Medicine

## 2011-11-05 DIAGNOSIS — J3489 Other specified disorders of nose and nasal sinuses: Secondary | ICD-10-CM

## 2011-11-05 DIAGNOSIS — J329 Chronic sinusitis, unspecified: Secondary | ICD-10-CM

## 2011-11-05 MED ORDER — MUPIROCIN 2 % EX OINT: TOPICAL_OINTMENT | Freq: Two times a day (BID) | CUTANEOUS | Status: DC

## 2011-11-09 ENCOUNTER — Ambulatory Visit (INDEPENDENT_AMBULATORY_CARE_PROVIDER_SITE_OTHER): Payer: BC Managed Care – PPO | Admitting: Family Medicine

## 2011-11-09 ENCOUNTER — Encounter: Payer: Self-pay | Admitting: Family Medicine

## 2011-11-09 VITALS — BP 119/69 | HR 89 | Temp 98.4°F | Ht 63.0 in | Wt 146.0 lb

## 2011-11-09 DIAGNOSIS — N390 Urinary tract infection, site not specified: Secondary | ICD-10-CM

## 2011-11-09 LAB — POCT URINALYSIS DIPSTICK
Ketones, UA: NEGATIVE
Leukocytes, UA: NEGATIVE
Nitrite, UA: NEGATIVE

## 2011-11-09 MED ORDER — LEVOTHYROXINE SODIUM 88 MCG PO TABS: 88.0000 ug | ORAL_TABLET | Freq: Every day | ORAL | Status: DC

## 2011-11-09 MED ORDER — CEPHALEXIN 500 MG PO CAPS: 500.0000 mg | ORAL_CAPSULE | Freq: Three times a day (TID) | ORAL | Status: AC

## 2011-11-09 MED ORDER — URELLE 81 MG PO TABS: 1.0000 | ORAL_TABLET | Freq: Four times a day (QID) | ORAL | Status: DC

## 2011-11-09 NOTE — Patient Instructions (Signed)

## 2011-11-09 NOTE — Addendum Note (Signed)
Addended by: Wyline Beady on: 11/09/2011 10:55 AM   Modules accepted: Orders

## 2011-11-09 NOTE — Progress Notes (Signed)
  Subjective:    Patient ID: Diana Porter, female    DOB: 16-Feb-1956, 56 y.o.   MRN: [id number]  HPI  Urinary sxs started yesterday. Says hard to pee.  Says having some pelvic pain. Using macrodantin yesterday. Took 6 of them.  Using cystex as well.  Using old rx of urosed.  No hematuria.  No fever.  No back pain.   Review of Systems     Objective:   Physical Exam  Constitutional: She is oriented to person, place, and time. She appears well-developed and well-nourished.  HENT:  Head: Normocephalic and atraumatic.  Abdominal: Soft. There is tenderness.       Tender suprapubically.   Neurological: She is alert and oriented to person, place, and time.  Skin: Skin is warm and dry.  Psychiatric: She has a normal mood and affect. Her behavior is normal.          Assessment & Plan:  UTI - She took 6 nitrofurantoin yesterday. Warned her that this is not safe.  Warned her that 2 in one day 12 hours at the most.  Will send a culture. Will tx with keflex for 5 days since hx of recurrent UTI.  Refilled her Urelle.

## 2011-11-11 LAB — URINE CULTURE: Colony Count: NO GROWTH

## 2011-11-16 ENCOUNTER — Ambulatory Visit (INDEPENDENT_AMBULATORY_CARE_PROVIDER_SITE_OTHER): Payer: BC Managed Care – PPO | Admitting: Family Medicine

## 2011-11-16 VITALS — BP 130/75 | HR 88 | Temp 98.5°F

## 2011-11-16 DIAGNOSIS — R300 Dysuria: Secondary | ICD-10-CM

## 2011-11-16 LAB — POCT URINALYSIS DIPSTICK
Protein, UA: 100
Spec Grav, UA: >=1.03
Urobilinogen, UA: 0.2
pH, UA: 6.5

## 2011-11-16 NOTE — Progress Notes (Signed)
  Subjective:    Patient ID: Diana Porter, female    DOB: Apr 11, 1956, 56 y.o.   MRN: [id number]  HPI Dysuria She says she felt little bit better man about a pronounced having urinary symptoms again. We initially got a urine culture on her but it was negative because she had taken 6 nitrofurantoin the day before she came in to the urinalysis. The repeat urinalysis today shows a little trace blood with a bilirubin she is also taking some type of AZO product.  I will not send in a second course of antibiotics at this time we will go ahead and send a urinalysis for a repeat culture.   Review of Systems     Objective:   Physical Exam        Assessment & Plan:

## 2011-11-16 NOTE — Progress Notes (Signed)
Patient ID: Diana Porter, female   DOB: 12/12/1955, 56 y.o.   MRN: [id number] UA and culture for dysuria

## 2011-11-19 ENCOUNTER — Other Ambulatory Visit: Payer: Self-pay | Admitting: Physician Assistant

## 2011-11-19 ENCOUNTER — Telehealth: Payer: Self-pay | Admitting: *Deleted

## 2011-11-19 MED ORDER — FLUCONAZOLE 150 MG PO TABS: 150.0000 mg | ORAL_TABLET | Freq: Once | ORAL | Status: AC

## 2011-11-19 NOTE — Telephone Encounter (Signed)
Sent Diflucan once now to pharmacy for yeast infection. Sent to [employer] box to call.

## 2011-11-19 NOTE — Telephone Encounter (Signed)
Pt is asking about the results of her urine cx. Please advise.

## 2011-11-20 NOTE — Telephone Encounter (Signed)
Pt informed

## 2011-11-26 ENCOUNTER — Encounter: Payer: Self-pay | Admitting: Physician Assistant

## 2011-11-26 ENCOUNTER — Ambulatory Visit (INDEPENDENT_AMBULATORY_CARE_PROVIDER_SITE_OTHER): Payer: BC Managed Care – PPO | Admitting: Physician Assistant

## 2011-11-26 VITALS — BP 124/66 | HR 87 | Temp 98.0°F | Ht 63.0 in | Wt 146.0 lb

## 2011-11-26 DIAGNOSIS — R894 Abnormal immunological findings in specimens from other organs, systems and tissues: Secondary | ICD-10-CM

## 2011-11-26 DIAGNOSIS — D803 Selective deficiency of immunoglobulin G [IgG] subclasses: Secondary | ICD-10-CM

## 2011-11-26 DIAGNOSIS — B379 Candidiasis, unspecified: Secondary | ICD-10-CM

## 2011-11-26 DIAGNOSIS — B49 Unspecified mycosis: Secondary | ICD-10-CM

## 2011-11-26 DIAGNOSIS — B370 Candidal stomatitis: Secondary | ICD-10-CM

## 2011-11-26 MED ORDER — FLUCONAZOLE 150 MG PO TABS: ORAL_TABLET | ORAL | Status: DC

## 2011-11-26 NOTE — Patient Instructions (Addendum)
Gluten-Free Diet Gluten is a protein found in many grains. Gluten is present in wheat, rye, and barley. Gluten may cause intestinal injury when ingested by people who are sensitive to gluten. A tissue sample (biopsy) of the small intestine is usually required for a positive diagnosis of gluten sensitivity. Dietary treatment consists of eliminating foods and food ingredients from wheat, rye, and barley. When these are excluded completely from the diet, most patients regain function of the small intestine. Strict compliance is important even during symptom-free periods. Gluten sensitive patients must realize that this is a lifelong diet. During the first stages of treatment, some people will also need to restrict dairy products that contain lactose, which is a naturally occurring sugar. Lactose is difficult to absorb when the small intestines are damaged. This is called lactose intolerance. WHY YOU NEED THIS DIET Ask your caregiver to explain the conditions that you may have.  Celiac disease / nontropical sprue / gluten-sensitive enteropathy.   Dermatitis herpetiformis.  SPECIAL NOTES  Gluten from wheat, rye, and barley protein interferes with absorbing food in individuals with gluten sensitivity. It is important to read all labels, as gluten may have been added as an incidental ingredient. Words to check for on the label include: flour, starch, durum flour, graham flour, phosphated flour, self-rising flour, semolina, farina, modified food starch, cereal, thickening, fillers, emulsifiers, malt flavoring, hydrolyzed vegetable protein. A Registered Dietician can help you identify possible harmful ingredients in the foods you normally eat.   If you are not sure whether an ingredient contains gluten, be sure to check with the manufacturer. Note that some manufacturers may change ingredients without notice. Always read labels.  Since flour and cereal products are quite often used in the preparation of foods,  it is important to be aware of the methods of preparation used, as well as the foods themselves. This is especially true when you are dining out. Starches  Allowed: Only those prepared from arrowroot, corn, potato, rice, and bean flours. Rice wafers (*); pure cornmeal tortillas; popcorn; some crackers and chips(*). Hot cereals made from cornmeal; Cream of Rice. Cold cereals such as puffed rice, Kellogg's Sugar Pops, Post's Fruity & Chocolate Pebbles, Van Brode's Corn Flakes and Crisp Rice, Featherweight's Corn Flakes; [employer] Mills' Cocoa Puffs, and Gluten-free Oatmeal. White or sweet potatoes; yams; hominy; rice or wild rice; special gluten-free pasta. Some oriental rice noodles or bean noodles.   Avoid: All wheat, and rye cereals; wheat germ, barley, bran, graham, malt, bulgur, and millet (-). NOTE: Avoid cereals containing malt as a flavoring such as Rice Krispies. Regular noodles, spaghetti, macaroni, most packaged rice mixes(*). All others containing wheat, rye, and/or barley.  Vegetables  Allowed: All plain, fresh, frozen, or canned vegetables.   Avoid: Creamed vegetables(*), vegetables canned in sauces(*). Any prepared with wheat, rye, or barley.  Fruit  Allowed: All fresh, frozen, canned, or dried fruits. Fruit juices.   Avoid: Thickened or prepared fruits; some pie fillings(*).  Meat and Meat Substitutes  Allowed: Meat, fish, poultry, or eggs prepared without added wheat, rye, or barley; luncheon meat(*), frankfurters(*), and pure meat. All aged cheese, processed cheese products(*). Cottage cheese(+), cream cheese(+). Dried beans and peas; lentils.   Avoid: Any meat or meat alternate containing wheat, rye, barley, or gluten stabilizers; Bread-containing products such as Swiss steak, croquettes, and meatloaf. Tuna canned in vegetable broth(*); turkey with HVP injected as part of the basting; any cheese product containing oat gum as an ingredient.  Milk  Allowed: Milk. Yogurt    made with allowed ingredients(*).   Avoid: Commercial chocolate milk which may have cereal added(*). Malted milk.  Soups and Combination Foods  Allowed: Homemade broth and soups made with allowed ingredients; some canned or frozen soups are allowed(*). Combination or prepared foods that do not contain gluten(*). Read labels.   Avoid: All soups containing wheat, rye, or barley flour. Bouillon and bouillon cubes that contain hydrolyzed vegetable protein (HVP). Combination or prepared foods that do contain gluten(*).  Desserts  Allowed: Custard, junket, homemade puddings from cornstarch, rice, and tapioca; some pudding mixes(*). Gelatin desserts, ices, and sherbet(*). Cake, cookies, and other desserts prepared with allowed flours.Some commercial ice creams(*).   Avoid: Cakes, cookies, doughnuts, pastries, etc., prepared with wheat, rye, and/or barley flour. Some commercial ice creams(*), ice cream flavors which contain cookies, crumbs, or cheesecake(*); ice cream cones. All commercially prepared mixes for cakes, cookies, and other desserts(*); bread pudding; puddings thickened with flour.  Sweets  Allowed: Sugar, honey, syrup(*), molasses, jelly, jam, plain hard candy, marshmallows, gumdrops, homemade candies free from wheat, rye, or barley. Coconut.   Avoid: Commercial candies containing wheat, rye, or barley(*). Almond Roca is dusted with wheat flour. Chocolate-coated nuts, which are often rolled in flour.  Fats and Oils  Allowed: Butter, margarine, vegetable oil, sour cream(+), whipping cream, shortening, lard, cream, mayonnaise(*). Some commercial salad dressings(*). Peanut butter.   Avoid: Some commercial salad dressings(*).  Beverages  Allowed: Coffee (regular or decaffeinated), tea, herbal tea (read label to be sure that no wheat flour has been added). Carbonated beverages; some root beers(*).   Avoid: Cereal beverages such as Postum or Ovaltine ; beer (unless Gluten free), ale,  malted milk; some root beers, wine, and sake.  Condiments  Allowed: Salt, pepper, herbs, spices, extracts, food colorings; monosodium glutamate (MSG); cider, rice, and wine vinegar; bicarbonate of soda; baking powder; Chun King soy sauce; nuts, coconut, chocolate, and pure cocoa powder.   Avoid: Some curry powder (*), some dry seasoning mixes (*), some gravy extracts (*), some meat sauces (*), some catsup (*), some prepared mustard (*), horseradish (*), some soy sauce (*), chip dips (*), some chewing gum (*). Yeast extract (contains barley). Caramel color (may contain malt).  Flour and thickening agents Allowed: Arrowroot starch (A), Corn bran (B), Corn flour (B,C,D), Corn germ (B), Cornmeal (B,C,D), Corn starch (A), Potato flour (B,C,E), Potato starch flour (B,C,E), Rice bran (B), Rice flours: Plain, brown (B,C,D,E) Sweet (A,B,C,F). Rice polish (B,C,G), Soy flour (B,C,G), Tapioca starch (A). ARE GOOD FOR: (A) Good thickening agent (B) Good when combined with other flours (C) Best combined with milk and eggs in baked products (D) Best in grainy-textured products (E) Produces drier product than other flours (F) Produces moister product than other flours (G) Adds distinct flavor to product; use in moderation. (*) Check labels and investigate any questionable ingredients.  (-) Additional research is needed before this product can be recommended. (+) Check vegetable gum used. * These amounts indicate the minimum number of servings needed from the basic food groups to provide a variety of nutrients essential to good health. The word "maximum" is used if amounts of certain foods eaten must be controlled. Combination foods may count as full or partial servings from the food groups. Dark green, leafy, or orange vegetables are recommended 3 or 4 times weekly to provide vitamin A. A good source of vitamin C is recommended daily.  NOTE: Brand names are used for clarification only, and do not constitute an  endorsement. Also, ingredients may be   changed by the manufacturer without notice. Always read labels. SAMPLE MEAL PLAN Breakfast   Fruit or juice.   Cereal (from allowed grains).   Toast (from allowed grains).   Heart-healthy tub margarine   Jam or jelly.   Milk beverage.  Lunch  Meat or meat substitute.   Gluten-free bread.   Vegetable or salad.   Heart-healthy margarine.   Fruit or dessert.   Milk beverage.  Dinner  Meat or meat substitute.   Potato or rice.   Salad with dressing or soup.   Gluten-free bread.   Vegetable.   Fruit or dessert.   Heart-healthy margarine.   Beverage.  SAMPLE MENU Breakfast   Orange juice.   Banana.   Rice or corn cereal.   Toast (gluten-free bread).   Heart-healthy tub margarine.   Jam.   Milk.   Coffee or tea.  Lunch  Chicken salad sandwich (with gluten-free bread and mayonnaise).   Sliced tomatoes.   Heart-healthy tub margarine.   Apple.   Milk.   Coffee or tea.  Dinner  Roast beef.   Baked potato.   Broccoli.   Lettuce salad with gluten-free dressing.   Gluten-free bread.   Custard.   Heart-healthy margarine.   Coffee or tea.  These meal plans are provided as samples. Your daily meal plans will vary. Document Released: 05/14/2005 Document Revised: 05/03/2011 Document Reviewed: 04/16/2011 [employer] Patient Information 2012 [employer]. 

## 2011-11-29 NOTE — Progress Notes (Signed)
  Subjective:    Patient ID: Diana Porter, female    DOB: 19-Sep-1955, 56 y.o.   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 Her Igg levels have been controlled for many year until last year when Dr. Eppie Gibson checked them. She is being seen by an infectious disease clinic and Dr. Carole Binning. She has not been happy with her care from Dr. Carole Binning. She is on half the dose of Immunoglobin that she was on previously before levels normalizing. She also does not think he has rechecked her IGG since starting immunoglobin therapy. She also has been sick since November. She has had numerous antibiotics for UTI's and sinusitis. She has been seen by ENT and they have ruled out sinusitis but she still complains of sinus pressure and pain. She was seen by Dr. Linford Arnold a week ago and did have a yeast infection. Was treated with one day of Diflucan. She still complains of discharge and vaginal itching. She also complains of white stuff in her mouth too. She brings in articles of invasive yeast infection and wonders if she could have this due to all the abx and the fact that her IGG is low.   Review of Systems     Objective:   Physical Exam  Constitutional: She is oriented to person, place, and time. She appears well-developed and well-nourished.  HENT:  Head: Normocephalic and atraumatic.       White thick but removable discharge in mouth and in the back of the pharynx.   Eyes: Conjunctivae are normal.  Neck: Normal range of motion. Neck supple. No thyromegaly present.  Cardiovascular: Normal rate, regular rhythm and normal heart sounds.   Pulmonary/Chest: Effort normal and breath sounds normal. She has no wheezes.  Neurological: She is alert and oriented to person, place, and time.  Skin: Skin is warm and dry.  Psychiatric: She has a normal mood and affect. Her behavior is normal.            Assessment & Plan:  Immunoglobulin G deficiency/Thrush/yeast infection- Needs to follow up with Dr. Carole Binning. INfectious disease needs to manage IGG and if she in fact has invasive yeast infection they need to treat her accordingly. She does have yeast in her mouth and hx of yeast inf with ongoing symptoms. I will treat with a course of Diflucan. She needs to follow up with ID in order to get evaluated appropriately for invasive yeast and changing to IGG.

## 2012-01-15 ENCOUNTER — Telehealth: Payer: Self-pay | Admitting: Family Medicine

## 2012-01-15 NOTE — Telephone Encounter (Signed)
Call pt: She is overdue for her pap and mammo. Would she like to schedule this for CPE w/ pap

## 2012-01-17 NOTE — Telephone Encounter (Signed)
Pt notified and she will call [facility] for pap and mammo

## 2012-01-18 ENCOUNTER — Ambulatory Visit (INDEPENDENT_AMBULATORY_CARE_PROVIDER_SITE_OTHER): Payer: BC Managed Care – PPO | Admitting: Family Medicine

## 2012-01-18 ENCOUNTER — Encounter: Payer: Self-pay | Admitting: Family Medicine

## 2012-01-18 ENCOUNTER — Other Ambulatory Visit (HOSPITAL_COMMUNITY)
Admission: RE | Admit: 2012-01-18 | Discharge: 2012-01-18 | Disposition: A | Discharge status: Home / Self Care | Payer: BC Managed Care – PPO | Source: Ambulatory Visit | Attending: Family Medicine | Admitting: Family Medicine

## 2012-01-18 ENCOUNTER — Other Ambulatory Visit: Payer: Self-pay | Admitting: Family Medicine

## 2012-01-18 VITALS — BP 138/84 | HR 123 | Wt 142.0 lb

## 2012-01-18 DIAGNOSIS — N949 Unspecified condition associated with female genital organs and menstrual cycle: Secondary | ICD-10-CM

## 2012-01-18 DIAGNOSIS — Z01419 Encounter for gynecological examination (general) (routine) without abnormal findings: Secondary | ICD-10-CM

## 2012-01-18 DIAGNOSIS — R102 Pelvic and perineal pain unspecified side: Secondary | ICD-10-CM

## 2012-01-18 DIAGNOSIS — Z1151 Encounter for screening for human papillomavirus (HPV): Secondary | ICD-10-CM | POA: Insufficient documentation

## 2012-01-18 DIAGNOSIS — B370 Candidal stomatitis: Secondary | ICD-10-CM

## 2012-01-18 LAB — POCT URINALYSIS DIPSTICK
Glucose, UA: NEGATIVE
Nitrite, UA: NEGATIVE
Protein, UA: NEGATIVE
Urobilinogen, UA: 0.2

## 2012-01-18 NOTE — Progress Notes (Signed)
  Subjective:    Patient ID: Diana Porter, female    DOB: 1956/01/11, 56 y.o.   MRN: [id number]  HPI She has been unemployed and paying her own insurance.  She did change ID docs for her gamma globulin infusions.  She has been really fatigued.  Dx with yeast infection and treated with Diflucan. She came in and saw Abanda the PA. She had a urine culture that was positive for yeast. She also had evidence of thrush at that time. They did increase her IvIG this time.  She was also treated by her IV.with one month's worth of Diflucan at 100 mg. Daily.  Still having some pelvic pain.  No vaginal itching or abnormal discharge.  She thinks she may have systemic yeast.  She is still having some white coating on the inside of her mouth. She does complain of dry mouth and has been using Biotene for this. She denies using any toothpaste to have whitening products. She denies been recently sexually active.   Review of Systems     Objective:   Physical Exam  Constitutional: She is oriented to person, place, and time. She appears well-developed and well-nourished.  HENT:  Head: Normocephalic and atraumatic.       OP with some peeling mucosa  No evidence of thrush per se.   Abdominal: Soft. Bowel sounds are normal. She exhibits no distension. There is tenderness. There is no rebound and no guarding.  Genitourinary: Vagina normal. There is no rash or tenderness on the right labia. There is no rash or tenderness on the left labia. Uterus is tender. Cervix exhibits no motion tenderness, no discharge and no friability. Right adnexum displays no mass and no tenderness. Left adnexum displays no mass and no tenderness. No vaginal discharge found.  Neurological: She is alert and oriented to person, place, and time.  Skin: Skin is warm and dry.  Psychiatric: She has a normal mood and affect. Her behavior is normal.          Assessment & Plan:  Pelvic Pain - Will check wet prep, u/a, culture and pap smear since due.   Her previous urine culture grew he is totally to require that today since she is having pelvic pain. Her exam is completely normal. We will followup Pap smear results as well. Thyroid level recently was normal . If all is normal then will schedule for Pelvic US or can wait until for f/u appt in September with new GyN.    Thrush-I did swab her mouth recurrent firm to see if there is any yeast or not. Will send for KOH. It does look like she has having some peeling mucosa which certainly can happen from irritation such as certain toothpastes, et Karie Soda. She currently uses Biotene because she does have excessively dry mouth. We diuscussed getting new toothbrush and avoiding recontamination.

## 2012-01-20 LAB — URINE CULTURE

## 2012-01-21 ENCOUNTER — Other Ambulatory Visit: Payer: Self-pay | Admitting: Family Medicine

## 2012-01-21 LAB — WET PREP, GENITAL

## 2012-01-21 MED ORDER — METRONIDAZOLE 0.75 % VA GEL: 1.0000 | Freq: Two times a day (BID) | VAGINAL | Status: DC

## 2012-01-24 ENCOUNTER — Telehealth: Payer: Self-pay | Admitting: *Deleted

## 2012-01-24 MED ORDER — CLINDAMYCIN PHOSPHATE 2 % VA CREA: 1.0000 | TOPICAL_CREAM | Freq: Every day | VAGINAL | Status: DC

## 2012-01-24 NOTE — Telephone Encounter (Signed)
Pt has called for a couple of reasons. She starts by saying that she can't use the metrogel that was called in b/c she has a seizure disorder and that flagyl makes her sick. She is asking if you can call in something different. Secondly, pt states she was upset that the medication was called in on the 26th and she received a call yesterday that it was sent. States it wouldn't be so bad but she is having the discomfort. I apologized to pt and informed her that when you called in something I would call her back and let her know.

## 2012-01-24 NOTE — Telephone Encounter (Signed)
The metronidazole is a topical gel. If not taken systemically that's why call her in the topical gel instead of the oral tabs. Therefore it should not make her sick to do the topical application. I also did get her results back until after 5:00 on the 26th so that was the first day we could possibly call her with the result. He did not come back any sooner. I'm not sure why her mouth swab came back before that wet prep but it did.

## 2012-01-24 NOTE — Telephone Encounter (Signed)
I informed pt when I spoke with her that it shouldn't make her sick since it is gel and then she states she read on the insert that you shouldn't use it if you have issues with seizures. I will inform her about the results.

## 2012-01-24 NOTE — Telephone Encounter (Signed)
Clindamycin sent to pharmacy per Dr. Linford Arnold. Pt informed.

## 2012-01-30 ENCOUNTER — Ambulatory Visit: Payer: Self-pay | Admitting: Family Medicine

## 2012-02-05 ENCOUNTER — Telehealth: Payer: Self-pay | Admitting: *Deleted

## 2012-02-05 ENCOUNTER — Encounter: Payer: Self-pay | Admitting: Obstetrics & Gynecology

## 2012-02-05 DIAGNOSIS — R102 Pelvic and perineal pain: Secondary | ICD-10-CM

## 2012-02-05 NOTE — Telephone Encounter (Signed)
Pt has called stating that she called GYN office with Dr.Leggett. States they are telling her that they need [place] to order the [place] and for her to bring the results to her appt. Please advise.

## 2012-02-05 NOTE — Telephone Encounter (Signed)
No problem. Order placed. They should contact her this week.

## 2012-02-05 NOTE — Telephone Encounter (Signed)
Pt informed

## 2012-02-06 ENCOUNTER — Other Ambulatory Visit: Payer: Self-pay | Admitting: Family Medicine

## 2012-02-06 DIAGNOSIS — R102 Pelvic and perineal pain: Secondary | ICD-10-CM

## 2012-02-08 ENCOUNTER — Other Ambulatory Visit: Payer: Self-pay

## 2012-02-11 ENCOUNTER — Ambulatory Visit (INDEPENDENT_AMBULATORY_CARE_PROVIDER_SITE_OTHER): Payer: BC Managed Care – PPO

## 2012-02-11 ENCOUNTER — Telehealth: Payer: Self-pay | Admitting: Family Medicine

## 2012-02-11 DIAGNOSIS — D252 Subserosal leiomyoma of uterus: Secondary | ICD-10-CM

## 2012-02-11 DIAGNOSIS — R102 Pelvic and perineal pain: Secondary | ICD-10-CM

## 2012-02-11 NOTE — Telephone Encounter (Signed)
She need to comin in for repeat wet prep to see if we have cleared the infection or not for we repeat medication. I am still awiating her [place] results. When did she go.

## 2012-02-11 NOTE — Telephone Encounter (Signed)
Will schedule an OV with Dr. Linford Arnold or Nurse visit for another wet prep

## 2012-02-11 NOTE — Telephone Encounter (Signed)
Had Vaginal US done today and states "it was painful and she started bleeding" someone told her to call the pharm to get her med refill. Should she come for the OV.

## 2012-02-11 NOTE — Telephone Encounter (Signed)
Patient walked-in request to have Clindamycin refill called back in she needs more for her vainal concerns because her issues has not been resolved from the first time you called her in this cream. Pt req to have called into her pharmacy today. Thanks

## 2012-02-11 NOTE — Telephone Encounter (Signed)
Left message to call office back

## 2012-02-12 ENCOUNTER — Ambulatory Visit (INDEPENDENT_AMBULATORY_CARE_PROVIDER_SITE_OTHER): Payer: BC Managed Care – PPO | Admitting: Family Medicine

## 2012-02-12 ENCOUNTER — Other Ambulatory Visit: Payer: Self-pay | Admitting: Family Medicine

## 2012-02-12 ENCOUNTER — Encounter: Payer: Self-pay | Admitting: Family Medicine

## 2012-02-12 VITALS — BP 137/78 | HR 92 | Wt 148.0 lb

## 2012-02-12 DIAGNOSIS — F329 Major depressive disorder, single episode, unspecified: Secondary | ICD-10-CM

## 2012-02-12 DIAGNOSIS — N760 Acute vaginitis: Secondary | ICD-10-CM

## 2012-02-12 DIAGNOSIS — J3489 Other specified disorders of nose and nasal sinuses: Secondary | ICD-10-CM

## 2012-02-12 LAB — POCT URINALYSIS DIPSTICK
Blood, UA: NEGATIVE
Spec Grav, UA: 1.025
Urobilinogen, UA: 0.2
pH, UA: 7

## 2012-02-12 NOTE — Patient Instructions (Addendum)
We will call you with your lab results. If you don't here from us in about a week then please give us a call at [phone].  

## 2012-02-12 NOTE — Progress Notes (Signed)
  Subjective:    Patient ID: Diana Porter, female    DOB: 03-10-1956, 56 y.o.   MRN: [id number]  HPI Vaginitis - Finished full course of the metronidazole gel. Says she felt better with treatment.  Pain started coming back last week.  Had her pelvic US yesterday was normal.  Was on diflucan for a month but has been off that for several weeks.  Says lost 10 lb on the diflucan.    She is  On celexa for the anxiety and depression.   She continues to have pain in her nose. She is seeing ENT and had a fairly negative workup. We have also scanned her sinuses and she was negative for infection. In addition she has seen her neurologist who put her on topiramate. She feels it hasn't really been working that well. She still wonders if there could be something else going on. Review of Systems     Objective:   Physical Exam  Constitutional: She is oriented to person, place, and time. She appears well-developed and well-nourished.  HENT:  Head: Normocephalic and atraumatic.  Cardiovascular: Normal rate, regular rhythm and normal heart sounds.   Pulmonary/Chest: Effort normal and breath sounds normal.  Neurological: She is alert and oriented to person, place, and time. Coordination abnormal.  Skin: Skin is warm and dry.  Psychiatric: She has a normal mood and affect. Her behavior is normal.          Assessment & Plan:  Vaginitis - Will repeat wet prep.  Will tx based on results.  I explained her that it's important to repeat the test to make sure that the infection is still there. Previously she had a yeast infection. We will treat as indicated. I did explain to her that she gets a third B. the infection that point I do recommend she sees GYN for further evaluation. Also if her wet prep is negative but she still has pain then I recommend that she also see GYN if in that her ultrasound was normal. She also reports that she's been off of her Premarin cream for quite some time. I encouraged her restart  that. That can also cause some vaginal atrophy that can also cause some discomfort if untreated.  Acute depression-she's been very stressed about her chronic conditions the last year and half. We discussed that I really think she would benefit from working with the therapist. She's very hesitant about this. She says she had a bad experience as a child. She did see Dr. Sunday Shams downstairs who is a psychiatrist. I explained her that I really think she is working some of his an actual therapist not just prescribes mostly medications. She says she will think about it.  Nasal pain-at this point in time I think it's probably I nerve that abnormally firing and causing a pain similar to be sent to her brain. Explained her that her CT showed that it is not an infection that the cause of her pain. ENT is actually skipped her and looked to make sure that she does not have any masses, lesions or polyps or obstructing. That's why the neurologist has put her on topirimate for the pain because most likely come from a nerve. Continue to work with the neurologist. I reassured her that sometimes these things also does seem to go away on their own.

## 2012-02-13 ENCOUNTER — Other Ambulatory Visit: Payer: Self-pay | Admitting: *Deleted

## 2012-02-13 ENCOUNTER — Other Ambulatory Visit: Payer: Self-pay | Admitting: Family Medicine

## 2012-02-13 ENCOUNTER — Telehealth: Payer: Self-pay | Admitting: *Deleted

## 2012-02-13 ENCOUNTER — Telehealth: Payer: Self-pay | Admitting: Family Medicine

## 2012-02-13 LAB — WET PREP, GENITAL: Yeast Wet Prep HPF POC: NONE SEEN

## 2012-02-13 MED ORDER — METRONIDAZOLE 0.75 % VA GEL: 1.0000 | Freq: Every day | VAGINAL | Status: AC

## 2012-02-13 MED ORDER — CLINDAMYCIN PHOSPHATE 2 % VA CREA: 1.0000 | TOPICAL_CREAM | Freq: Every day | VAGINAL | Status: AC

## 2012-02-13 NOTE — Telephone Encounter (Signed)
Please see results note for details. Flagyl is topical so there is now way it makes her "sick". If she wants the clinda ok to refill but it is not my recommendation.

## 2012-02-13 NOTE — Telephone Encounter (Signed)
Patient called request to have Clindamycin called into the pharmacy. She states something was called into the pharmacy based on her results from her wet prep she just had but it's not the correct cream. She thinks the lab may have called in the cream but the wrong one was called in. Patient would like to have Clindamycin called in today. Thanks

## 2012-02-13 NOTE — Telephone Encounter (Signed)
She is overdue for her mammogram. Can we schedule that for her?

## 2012-02-13 NOTE — Telephone Encounter (Signed)
Clindamycin refilled and patient notified of wet prep results and need to repeat in 8-10 days

## 2012-02-13 NOTE — Telephone Encounter (Signed)
Pt called and requested Clindamycin refill because she said,Flagyl makes her sick Uses [facility]

## 2012-02-15 NOTE — Telephone Encounter (Signed)
LM for pt to returncall

## 2012-02-20 NOTE — Telephone Encounter (Signed)
Called and left message for pt that she is overdue and she can call us to have [place] put in order or can call [facility] and they can contact us

## 2012-03-27 ENCOUNTER — Encounter (HOSPITAL_COMMUNITY): Payer: Self-pay | Admitting: Psychiatry

## 2012-03-28 ENCOUNTER — Telehealth: Payer: Self-pay | Admitting: *Deleted

## 2012-03-28 DIAGNOSIS — Z1211 Encounter for screening for malignant neoplasm of colon: Secondary | ICD-10-CM

## 2012-03-28 NOTE — Telephone Encounter (Signed)
Remind her needs appt sooner to meet with nurse so can then schedule her colon dates when her mother is visitin.

## 2012-03-28 NOTE — Telephone Encounter (Signed)
Pt needs a referral to have colonoscopy done. Prefers [place].  Can do on these dates: Nov. 21- December 3rd. Mom visiting and she will be the one to take her.

## 2012-04-15 ENCOUNTER — Telehealth: Payer: Self-pay | Admitting: *Deleted

## 2012-04-15 ENCOUNTER — Other Ambulatory Visit (HOSPITAL_COMMUNITY)
Admission: RE | Admit: 2012-04-15 | Discharge: 2012-04-15 | Disposition: A | Discharge status: Home / Self Care | Payer: BC Managed Care – PPO | Source: Ambulatory Visit | Attending: Sports Medicine | Admitting: Sports Medicine

## 2012-04-15 ENCOUNTER — Other Ambulatory Visit: Payer: Self-pay | Admitting: Sports Medicine

## 2012-04-15 ENCOUNTER — Encounter: Payer: Self-pay | Admitting: Sports Medicine

## 2012-04-15 ENCOUNTER — Ambulatory Visit (INDEPENDENT_AMBULATORY_CARE_PROVIDER_SITE_OTHER): Payer: BC Managed Care – PPO | Admitting: Sports Medicine

## 2012-04-15 VITALS — BP 123/82 | HR 98 | Wt 141.0 lb

## 2012-04-15 DIAGNOSIS — R300 Dysuria: Secondary | ICD-10-CM

## 2012-04-15 DIAGNOSIS — R309 Painful micturition, unspecified: Secondary | ICD-10-CM

## 2012-04-15 DIAGNOSIS — R3989 Other symptoms and signs involving the genitourinary system: Secondary | ICD-10-CM

## 2012-04-15 DIAGNOSIS — N760 Acute vaginitis: Secondary | ICD-10-CM | POA: Insufficient documentation

## 2012-04-15 DIAGNOSIS — R894 Abnormal immunological findings in specimens from other organs, systems and tissues: Secondary | ICD-10-CM

## 2012-04-15 DIAGNOSIS — N390 Urinary tract infection, site not specified: Secondary | ICD-10-CM

## 2012-04-15 LAB — POCT URINALYSIS DIPSTICK
Blood, UA: NEGATIVE
Glucose, UA: NEGATIVE
Ketones, UA: 40
Leukocytes, UA: NEGATIVE
Nitrite, UA: NEGATIVE
Protein, UA: 30
Spec Grav, UA: >=1.03
Urobilinogen, UA: 0.2
pH, UA: 5.5

## 2012-04-15 NOTE — Progress Notes (Signed)
Subjective:    CC: Dysuria  HPI: Driana comes in with a several-day history of burning when she urinates. She has a history of multiple UTIs, multiple episodes of vaginitis with bacterial and candidal, she also history of immunoglobulin G. deficiency for which she is taking immunoglobulin intravenous supplements with infectious disease physician at [facility]. Lately, she also endorses abdominal pain in the epigastrium, as well as suprapubic. Fevers and chills, as well as vomiting. She notes early satiety. She also notes weight gain.  She also notes that she has never been tested for lupus, and gets frequent sinus infections. She desires that we do a wet prep, and urine tests but is also wondering if we can check her immunoglobulin levels.  Past medical history, Surgical history, Family history, Social history, Allergies, and medications have been entered into the medical record, reviewed, and no changes needed.   Review of Systems: No fevers, chills, night sweats, weight loss, chest pain, or shortness of breath.   Objective:    General: Well Developed, well nourished, and in no acute distress.  Neuro: Alert and oriented x3, extra-ocular muscles intact.  HEENT: Normocephalic, atraumatic, pupils equal round reactive to light, neck supple, no masses, no lymphadenopathy, thyroid nonpalpable.  Skin: Warm and dry, no rashes. Cardiac: Regular rate and rhythm, no murmurs rubs or gallops.  Respiratory: Clear to auscultation bilaterally. Not using accessory muscles, speaking in full sentences. Abdomen: Soft, tender to palpation in the epigastrium with palpable fullness, possible mass. There is no rebound tenderness, her bowel sounds are normal.  Urinalysis shows high specific gravity, bilirubin, no leukocytes or nitrites. This was sent for culture.  Patient's self collected wet prep and this will be sent off.  Impression and Recommendations:

## 2012-04-15 NOTE — Assessment & Plan Note (Addendum)
The above symptoms are all likely related to her immunoglobulin deficiency. Urinalysis is not suggestive of cystitis, or pyelonephritis. We'll go ahead and culture the urine, checking some blood work. With her history of multiple sinus infections as well I would like to get ANCA just to look for Wegener's granulomatosis. ANA. We'll also get CBC, and CMET. Lipase. Pt asks to have immunoglobulins checked. Also noted an epigastric mass, we'll CT abdomen and pelvis with IV and oral contrast.

## 2012-04-16 ENCOUNTER — Telehealth: Payer: Self-pay | Admitting: *Deleted

## 2012-04-16 ENCOUNTER — Other Ambulatory Visit: Payer: Self-pay | Admitting: Family Medicine

## 2012-04-16 LAB — ANA: Anti Nuclear Antibody(ANA): NEGATIVE

## 2012-04-16 LAB — COMPREHENSIVE METABOLIC PANEL WITH GFR
AST: 24 U/L (ref 0–37)
Albumin: 4.1 g/dL (ref 3.5–5.2)
Alkaline Phosphatase: 49 U/L (ref 39–117)
BUN: 13 mg/dL (ref 6–23)
Potassium: 4.1 meq/L (ref 3.5–5.3)
Sodium: 142 meq/L (ref 135–145)
Total Bilirubin: 0.6 mg/dL (ref 0.3–1.2)

## 2012-04-16 LAB — ANCA SCREEN W REFLEX TITER
Atypical p-ANCA Screen: NEGATIVE
c-ANCA Screen: NEGATIVE
p-ANCA Screen: NEGATIVE

## 2012-04-16 LAB — COMPREHENSIVE METABOLIC PANEL
ALT: 19 U/L (ref 0–35)
CO2: 21 mEq/L (ref 19–32)
Calcium: 9.4 mg/dL (ref 8.4–10.5)
Chloride: 104 mEq/L (ref 96–112)
Creat: 0.85 mg/dL (ref 0.50–1.10)
Glucose, Bld: 83 mg/dL (ref 70–99)
Total Protein: 6.8 g/dL (ref 6.0–8.3)

## 2012-04-16 LAB — CBC WITH DIFFERENTIAL/PLATELET
Basophils Absolute: 0 K/uL (ref 0.0–0.1)
Basophils Relative: 1 % (ref 0–1)
Eosinophils Absolute: 0.1 10*3/uL (ref 0.0–0.7)
Eosinophils Relative: 1 % (ref 0–5)
HCT: 46.8 % — ABNORMAL HIGH (ref 36.0–46.0)
Hemoglobin: 15.8 g/dL — ABNORMAL HIGH (ref 12.0–15.0)
Lymphocytes Relative: 34 % (ref 12–46)
Lymphs Abs: 2.6 10*3/uL (ref 0.7–4.0)
MCH: 31.1 pg (ref 26.0–34.0)
MCHC: 33.8 g/dL (ref 30.0–36.0)
MCV: 92.1 fL (ref 78.0–100.0)
Monocytes Absolute: 0.5 K/uL (ref 0.1–1.0)
Monocytes Relative: 7 % (ref 3–12)
Neutro Abs: 4.5 K/uL (ref 1.7–7.7)
Neutrophils Relative %: 57 % (ref 43–77)
Platelets: 239 10*3/uL (ref 150–400)
RBC: 5.08 MIL/uL (ref 3.87–5.11)
RDW: 13.5 % (ref 11.5–15.5)
WBC: 7.8 10*3/uL (ref 4.0–10.5)

## 2012-04-16 LAB — OTHER SOLSTAS TEST
IgA: 132 mg/dL (ref 69–380)
IgE (Immunoglobulin E), Serum: 37.1 [IU]/mL (ref 0.0–180.0)
IgG (Immunoglobin G), Serum: 1120 mg/dL (ref 690–1700)
IgM, Serum: 94 mg/dL (ref 52–322)

## 2012-04-16 LAB — HIV ANTIBODY (ROUTINE TESTING W REFLEX): HIV: NONREACTIVE

## 2012-04-16 NOTE — Telephone Encounter (Signed)
PA obtained for CT ABD/PELVIS w/ contrast good from 04/16/12 to 05/15/12. Auth # is [id number]. Linda at [facility] informed.

## 2012-04-16 NOTE — Telephone Encounter (Signed)
Is this ok?

## 2012-04-17 LAB — URINE CULTURE: Colony Count: 7000

## 2012-04-18 ENCOUNTER — Telehealth: Payer: Self-pay | Admitting: *Deleted

## 2012-04-18 NOTE — Telephone Encounter (Signed)
Pt calling for lab results. Please review.

## 2012-04-20 NOTE — Telephone Encounter (Signed)
See lab note from 04/15/12.  Sometime attempted to call her.

## 2012-04-21 ENCOUNTER — Ambulatory Visit (HOSPITAL_BASED_OUTPATIENT_CLINIC_OR_DEPARTMENT_OTHER)
Admission: RE | Admit: 2012-04-21 | Discharge: 2012-04-21 | Disposition: A | Discharge status: Home / Self Care | Payer: BC Managed Care – PPO | Source: Ambulatory Visit | Attending: Family Medicine | Admitting: Family Medicine

## 2012-04-21 ENCOUNTER — Ambulatory Visit (HOSPITAL_BASED_OUTPATIENT_CLINIC_OR_DEPARTMENT_OTHER)
Admission: RE | Admit: 2012-04-21 | Discharge: 2012-04-21 | Disposition: A | Discharge status: Home / Self Care | Payer: BC Managed Care – PPO | Source: Ambulatory Visit | Attending: Sports Medicine | Admitting: Sports Medicine

## 2012-04-21 DIAGNOSIS — K7689 Other specified diseases of liver: Secondary | ICD-10-CM | POA: Insufficient documentation

## 2012-04-21 DIAGNOSIS — R6881 Early satiety: Secondary | ICD-10-CM | POA: Insufficient documentation

## 2012-04-21 DIAGNOSIS — Z1231 Encounter for screening mammogram for malignant neoplasm of breast: Secondary | ICD-10-CM | POA: Insufficient documentation

## 2012-04-21 DIAGNOSIS — N390 Urinary tract infection, site not specified: Secondary | ICD-10-CM | POA: Insufficient documentation

## 2012-04-21 MED ORDER — IOHEXOL 300 MG/ML  SOLN
100.0000 mL | Freq: Once | INTRAMUSCULAR | Status: AC | PRN
  Administered 2012-04-21: 100 mL via INTRAVENOUS

## 2012-04-21 NOTE — Telephone Encounter (Signed)
Pt informed

## 2012-04-21 NOTE — Telephone Encounter (Signed)
Pt states she has only been contacted with Immunoglobulin IgG, IgA, IgM, and IgE levels. She is looking for all other lab results.

## 2012-04-21 NOTE — Telephone Encounter (Signed)
HIV, etc is all negative.

## 2012-04-22 ENCOUNTER — Other Ambulatory Visit: Payer: BC Managed Care – PPO

## 2012-04-22 ENCOUNTER — Other Ambulatory Visit: Payer: Self-pay | Admitting: Family Medicine

## 2012-04-22 ENCOUNTER — Encounter: Payer: Self-pay | Admitting: Family Medicine

## 2012-04-22 ENCOUNTER — Ambulatory Visit (INDEPENDENT_AMBULATORY_CARE_PROVIDER_SITE_OTHER): Payer: BC Managed Care – PPO | Admitting: Family Medicine

## 2012-04-22 ENCOUNTER — Other Ambulatory Visit (HOSPITAL_BASED_OUTPATIENT_CLINIC_OR_DEPARTMENT_OTHER): Payer: BC Managed Care – PPO

## 2012-04-22 VITALS — BP 110/61 | HR 80 | Ht 63.0 in | Wt 142.0 lb

## 2012-04-22 DIAGNOSIS — R6881 Early satiety: Secondary | ICD-10-CM

## 2012-04-22 DIAGNOSIS — R198 Other specified symptoms and signs involving the digestive system and abdomen: Secondary | ICD-10-CM

## 2012-04-22 DIAGNOSIS — B3731 Acute candidiasis of vulva and vagina: Secondary | ICD-10-CM

## 2012-04-22 DIAGNOSIS — B373 Candidiasis of vulva and vagina: Secondary | ICD-10-CM

## 2012-04-22 DIAGNOSIS — R1011 Right upper quadrant pain: Secondary | ICD-10-CM

## 2012-04-22 NOTE — Progress Notes (Signed)
  Subjective:    Patient ID: Diana Porter, female    DOB: 1955/11/06, 56 y.o.   MRN: [id number]  HPI F/U RUQ pain and bulge.  He to review her CT and lab results.  Started a few months and now bothering her everyday. Has f/u with GI on Tuesday of next week. Says still seeing a bulge.  She says she's getting early satiety. Says she can eat half a sandwich and suddenly will feel very full and very bloated. The bulge she describes as mostly in the right upper quadrant. She'll notice it especially in the shower first thing in the morning. She says it feels sore and is occasionally painful. When it hurts it more as a sharp pain. No significant nausea. No diarrhea. No dysphasia. No alleviating or worsening factors. Though she says it seems to hurt more after she eats.  Has appt with [facility] with the Immunology department.   She is still having pain with urination. A urinalysis as well as a wet prep was ordered. The urine culture came back negative but she never received the results of the wet prep is still having symptoms. She denies any significant vaginal discharge or odor. She has had previous problems with yeast infections as well as to bacterial vaginitis.   Review of Systems     Objective:   Physical Exam  Constitutional: She is oriented to person, place, and time. She appears well-developed and well-nourished.  HENT:  Head: Normocephalic and atraumatic.  Cardiovascular: Normal rate, regular rhythm and normal heart sounds.   Pulmonary/Chest: Effort normal and breath sounds normal.  Abdominal: Soft. Bowel sounds are normal. She exhibits no distension and no mass. There is tenderness. There is no rebound and no guarding.       Tender near the umbilicus and the LLQ.   Neurological: She is alert and oriented to person, place, and time.  Skin: Skin is warm and dry.  Psychiatric: She has a normal mood and affect. Her behavior is normal.          Assessment & Plan:  RUQ Pain - discussed  could be an abdominal wall hernia vs gastritis vs esophagitis.  At this point time she or he has an appointment with her gastroenterologist to followup for her colonoscopy on Tuesday of next week. I encouraged her to discuss her discomfort and early satiety with him. If he agrees that this could potentially be an abdominal wall hernia, then I asked her to call me back after her appointment and I be happy to put in a referral to a general surgeon. The he may want to do additional workup such as endoscopy for early satiety we discussed this today. She might benefit from starting a PPI, to see if this improves her symptoms but she wants to wait until her appointment on Tuesday. I did review her CT scan with her as well as all her lab work and gave her copies.  Early satiety-I definitely want to discuss this with her gastroneurology for next week at her appointment.  Dysuria-urine culture was negative so we'll repeat her wet prep and call her when the results are available. I apologized. Not sure why her original sample from the 19th of this month was not sent.  Time spent 25 min, >50% spent in counseling and reviewing lab results.

## 2012-04-22 NOTE — Patient Instructions (Addendum)
Please call me on Tuesday and let me know if you need a general surgery referral.

## 2012-04-23 ENCOUNTER — Other Ambulatory Visit: Payer: Self-pay | Admitting: Family Medicine

## 2012-04-23 ENCOUNTER — Telehealth: Payer: Self-pay | Admitting: Family Medicine

## 2012-04-23 LAB — WET PREP, GENITAL

## 2012-04-23 MED ORDER — CLINDAMYCIN PHOSPHATE 2 % VA CREA: 1.0000 | TOPICAL_CREAM | Freq: Every day | VAGINAL | Status: DC

## 2012-04-23 NOTE — Telephone Encounter (Signed)
Please consult slide and see if they have run her wet prep yet. I would like to have the results back before the end of the day so we can know before the holiday what the results are. We will need to expedite this because the one that she did on the 19th was evidently lost and never run.

## 2012-04-23 NOTE — Telephone Encounter (Signed)
This is being resulted now so hopefully results will be available soon.

## 2012-04-25 ENCOUNTER — Other Ambulatory Visit: Payer: Self-pay | Admitting: Family Medicine

## 2012-04-25 DIAGNOSIS — R928 Other abnormal and inconclusive findings on diagnostic imaging of breast: Secondary | ICD-10-CM

## 2012-04-28 ENCOUNTER — Encounter: Payer: Self-pay | Admitting: Family Medicine

## 2012-04-29 ENCOUNTER — Encounter: Payer: Self-pay | Admitting: *Deleted

## 2012-04-30 ENCOUNTER — Telehealth: Payer: Self-pay

## 2012-04-30 NOTE — Telephone Encounter (Signed)
Seh can come in for wet prep tomorrow if would like, but if she is not better on teh clindamycin then more days it not going to make this better.

## 2012-04-30 NOTE — Telephone Encounter (Signed)
Left message for patient to return call. Schedule appointment.

## 2012-04-30 NOTE — Telephone Encounter (Signed)
Diana Porter complains she is still having pelvic pain. Today is her last day of the clindamycin. She wants another prescription of the clindamycin sent to the pharmacy. I did advise patient that we would not call her back until tomorrow. She is ok with [place] calling tomorrow. Please advise.

## 2012-05-01 ENCOUNTER — Other Ambulatory Visit: Payer: Self-pay | Admitting: Physician Assistant

## 2012-05-01 ENCOUNTER — Ambulatory Visit (INDEPENDENT_AMBULATORY_CARE_PROVIDER_SITE_OTHER): Payer: BC Managed Care – PPO | Admitting: Physician Assistant

## 2012-05-01 ENCOUNTER — Encounter: Payer: Self-pay | Admitting: Physician Assistant

## 2012-05-01 VITALS — BP 110/71 | HR 71 | Ht 63.0 in | Wt 140.0 lb

## 2012-05-01 DIAGNOSIS — R1030 Lower abdominal pain, unspecified: Secondary | ICD-10-CM

## 2012-05-01 DIAGNOSIS — N760 Acute vaginitis: Secondary | ICD-10-CM

## 2012-05-01 DIAGNOSIS — R109 Unspecified abdominal pain: Secondary | ICD-10-CM

## 2012-05-01 MED ORDER — TRAMADOL HCL 50 MG PO TABS: 50.0000 mg | ORAL_TABLET | Freq: Four times a day (QID) | ORAL | Status: DC | PRN

## 2012-05-01 NOTE — Patient Instructions (Addendum)
Will call with results of Wet prep. Use Tramadol for pain until can see OBGYN.

## 2012-05-01 NOTE — Telephone Encounter (Signed)
Patient called and left a message on nurse line asking for a return call.   Returned Call: Schedule appointment

## 2012-05-02 LAB — WET PREP, GENITAL: Clue Cells Wet Prep HPF POC: NONE SEEN

## 2012-05-02 NOTE — Progress Notes (Signed)
  Subjective:    Patient ID: Diana Porter, female    DOB: Feb 14, 1956, 56 y.o.   MRN: [id number]  HPI Patient presents to the clinic to follow up on vaginitis symptoms. She was previously dx with BV. She states she cannot take metronidazole either vaginally or orally due to nausea and vomiting. She has used clindamycin gel but feels symptoms have gotten better but not resolved. She is still having pelvic pain on both sides. She has had transvaginal U/S and did show a small cyst on left ovary but using watchful waiting. She denies any sexual activity. She wants to know if a hysterectomy will get rid of all of these symptoms.   She still continues to go for immunoglobin. She was supposed to get earlier this week but there was no order. She hopes to get soon.    Review of Systems     Objective:   Physical Exam  Constitutional: She is oriented to person, place, and time. She appears well-developed and well-nourished.  HENT:  Head: Normocephalic and atraumatic.  Cardiovascular: Normal rate, regular rhythm and normal heart sounds.   Pulmonary/Chest: Effort normal and breath sounds normal.  Abdominal: Soft. Bowel sounds are normal. She exhibits no distension and no mass. There is no tenderness. There is no rebound and no guarding.  Neurological: She is alert and oriented to person, place, and time.  Skin: Skin is warm and dry.  Psychiatric: She has a normal mood and affect. Her behavior is normal.          Assessment & Plan:  Vaginitis- Will recheck a wet prep. I do not want to treat pt for something she doesn't have. I informed her that clindamycin is sub-standard treatment and may not be getting rid of infection. If she still has infection will try oral clindamycin.   Lower abdominal pain- no need for STD testing since not sexually active. Pt has had extensive work up. She was instructed to follow up with OBGYN to discuss hysterectomy. She was given tramadol for break through pain.

## 2012-05-06 ENCOUNTER — Ambulatory Visit
Admission: RE | Admit: 2012-05-06 | Discharge: 2012-05-06 | Disposition: A | Discharge status: Home / Self Care | Payer: BC Managed Care – PPO | Source: Ambulatory Visit | Attending: Family Medicine | Admitting: Family Medicine

## 2012-05-06 ENCOUNTER — Other Ambulatory Visit: Payer: Self-pay | Admitting: Family Medicine

## 2012-05-06 DIAGNOSIS — R928 Other abnormal and inconclusive findings on diagnostic imaging of breast: Secondary | ICD-10-CM

## 2012-05-06 DIAGNOSIS — R921 Mammographic calcification found on diagnostic imaging of breast: Secondary | ICD-10-CM

## 2012-05-12 ENCOUNTER — Other Ambulatory Visit: Payer: Self-pay | Admitting: Family Medicine

## 2012-05-12 ENCOUNTER — Ambulatory Visit
Admission: RE | Admit: 2012-05-12 | Discharge: 2012-05-12 | Disposition: A | Discharge status: Home / Self Care | Payer: BC Managed Care – PPO | Source: Ambulatory Visit | Attending: Family Medicine | Admitting: Family Medicine

## 2012-05-12 DIAGNOSIS — R921 Mammographic calcification found on diagnostic imaging of breast: Secondary | ICD-10-CM

## 2012-05-13 ENCOUNTER — Ambulatory Visit
Admission: RE | Admit: 2012-05-13 | Discharge: 2012-05-13 | Disposition: A | Discharge status: Home / Self Care | Payer: BC Managed Care – PPO | Source: Ambulatory Visit | Attending: Family Medicine | Admitting: Family Medicine

## 2012-05-13 DIAGNOSIS — R921 Mammographic calcification found on diagnostic imaging of breast: Secondary | ICD-10-CM

## 2012-05-18 ENCOUNTER — Other Ambulatory Visit: Payer: Self-pay | Admitting: Family Medicine

## 2012-10-13 ENCOUNTER — Encounter: Payer: Self-pay | Admitting: Family Medicine

## 2012-10-13 ENCOUNTER — Ambulatory Visit (INDEPENDENT_AMBULATORY_CARE_PROVIDER_SITE_OTHER): Payer: BC Managed Care – PPO | Admitting: Family Medicine

## 2012-10-13 VITALS — BP 113/69 | HR 72 | Wt 127.0 lb

## 2012-10-13 DIAGNOSIS — F341 Dysthymic disorder: Secondary | ICD-10-CM

## 2012-10-13 DIAGNOSIS — E039 Hypothyroidism, unspecified: Secondary | ICD-10-CM

## 2012-10-13 DIAGNOSIS — F418 Other specified anxiety disorders: Secondary | ICD-10-CM

## 2012-10-13 DIAGNOSIS — D803 Selective deficiency of immunoglobulin G [IgG] subclasses: Secondary | ICD-10-CM

## 2012-10-13 DIAGNOSIS — R894 Abnormal immunological findings in specimens from other organs, systems and tissues: Secondary | ICD-10-CM

## 2012-10-13 DIAGNOSIS — N952 Postmenopausal atrophic vaginitis: Secondary | ICD-10-CM

## 2012-10-13 MED ORDER — ESTROGENS, CONJUGATED 0.625 MG/GM VA CREA: TOPICAL_CREAM | VAGINAL | Status: AC

## 2012-10-13 MED ORDER — LEVOTHYROXINE SODIUM 88 MCG PO TABS: 88.0000 ug | ORAL_TABLET | Freq: Every day | ORAL | Status: DC

## 2012-10-13 MED ORDER — CITALOPRAM HYDROBROMIDE 20 MG PO TABS: 20.0000 mg | ORAL_TABLET | Freq: Every day | ORAL | Status: DC

## 2012-10-13 NOTE — Progress Notes (Signed)
  Subjective:    Patient ID: Diana Porter, female    DOB: Aug 13, 1955, 57 y.o.   MRN: [id number]  HPI Depression and Anxiety - Taking citalopram per Dr. Christell Constant. She was d/c from the practice about 6 months ago. Her insurance didn't cover psych care anyway.  Says overall doing well. Happy with current regimen.  She would like me to take over this.    IgG deficiency - she is now going to [facility] and says they have increased her dose and she has felt better.   Vaginal atrophy - Would like a refill on premarin. Says worked really well.  Has been out for months.    Hypothyroid - No weight changes. Doing well and would like refills on medication.  Review of Systems     Objective:   Physical Exam  Constitutional: She is oriented to person, place, and time. She appears well-developed and well-nourished.  HENT:  Head: Normocephalic and atraumatic.  Cardiovascular: Normal rate, regular rhythm and normal heart sounds.   Pulmonary/Chest: Effort normal and breath sounds normal.  Neurological: She is alert and oriented to person, place, and time.  Skin: Skin is warm and dry.  Psychiatric: She has a normal mood and affect. Her behavior is normal.          Assessment & Plan:  Depression and Anxiety - Well controlled.  GAD- 7 score 4.  PHQ- 9 score of 7 . Will refill citalopram.  F/U in 6 months.   IgG Deficiency - Doing much better overall. Will be on tx until December.    Vaginal Atrophy - Refill premarin.   Hypothyroid - Will check TSH.  Will send over 30 days until know if level is well controlled.

## 2012-10-14 NOTE — Progress Notes (Signed)
Quick Note:  All labs are normal. ______ 

## 2012-10-27 ENCOUNTER — Ambulatory Visit (INDEPENDENT_AMBULATORY_CARE_PROVIDER_SITE_OTHER): Payer: BC Managed Care – PPO | Admitting: Physician Assistant

## 2012-10-27 ENCOUNTER — Encounter: Payer: Self-pay | Admitting: Physician Assistant

## 2012-10-27 VITALS — BP 108/59 | HR 76 | Temp 98.0°F | Wt 127.0 lb

## 2012-10-27 DIAGNOSIS — J329 Chronic sinusitis, unspecified: Secondary | ICD-10-CM

## 2012-10-27 DIAGNOSIS — Z2089 Contact with and (suspected) exposure to other communicable diseases: Secondary | ICD-10-CM

## 2012-10-27 DIAGNOSIS — J029 Acute pharyngitis, unspecified: Secondary | ICD-10-CM

## 2012-10-27 DIAGNOSIS — Z20818 Contact with and (suspected) exposure to other bacterial communicable diseases: Secondary | ICD-10-CM

## 2012-10-27 MED ORDER — DOXYCYCLINE HYCLATE 100 MG PO CAPS: 100.0000 mg | ORAL_CAPSULE | Freq: Two times a day (BID) | ORAL | Status: DC

## 2012-10-27 NOTE — Patient Instructions (Signed)

## 2012-10-27 NOTE — Progress Notes (Signed)
  Subjective:    Patient ID: Diana Porter, female    DOB: 12-20-1955, 57 y.o.   MRN: [id number]  HPI Patient presents to the clinic with sore throat. She is concerned because her nephew had strep throat last weekend. She has had a tonsilectomy and not had strep since. She has a lot of sinus pressure, ear pain bilaterally, difficultly swallowing due to pain. She has only been able to eat soup/drink water. She reports a low grade fever but none in office today. She has tried Aleve cold and sinus and helps some. Cough is dry. Claritin daily but not helping. No SOB, wheezing, n/v/d.     Review of Systems     Objective:   Physical Exam  Constitutional: She is oriented to person, place, and time. She appears well-developed and well-nourished.  HENT:  Head: Normocephalic and atraumatic.  TM's are bilaterally due with light reflex hard to visualize. Oropharynx is erythematous with no exudate.   Maxillary sinuses tender to palpation bilaterally.  Eyes: Conjunctivae are normal. Right eye exhibits no discharge. Left eye exhibits no discharge.  Neck: Normal range of motion. Neck supple.  Tender, enlarged anterior cervical lymph nodes bilaterally.  Cardiovascular: Normal rate, regular rhythm and normal heart sounds.   Pulmonary/Chest: Effort normal and breath sounds normal.  Neurological: She is alert and oriented to person, place, and time.  Skin: Skin is warm and dry.  Psychiatric: She has a normal mood and affect. Her behavior is normal.          Assessment & Plan:  Sinusitis/sore throat- Rapid strep negative. Will treat with Doxy for sinusitis due to multiple allergies. Discussed other symptomatic treatment for pressure and sore throat. Call if not improving.

## 2012-11-06 ENCOUNTER — Other Ambulatory Visit: Payer: Self-pay | Admitting: Family Medicine

## 2012-11-14 ENCOUNTER — Ambulatory Visit (INDEPENDENT_AMBULATORY_CARE_PROVIDER_SITE_OTHER): Payer: BC Managed Care – PPO | Admitting: Physician Assistant

## 2012-11-14 ENCOUNTER — Encounter: Payer: Self-pay | Admitting: Physician Assistant

## 2012-11-14 VITALS — BP 110/69 | HR 73 | Temp 97.6°F | Wt 128.0 lb

## 2012-11-14 DIAGNOSIS — J329 Chronic sinusitis, unspecified: Secondary | ICD-10-CM

## 2012-11-14 MED ORDER — PREDNISONE 20 MG PO TABS: ORAL_TABLET | ORAL | Status: DC

## 2012-11-14 NOTE — Progress Notes (Signed)
  Subjective:    Patient ID: Diana Porter, female    DOB: Oct 02, 1955, 57 y.o.   MRN: [id number]  HPI Patient presents to the clinic with sinus pressure and headache that continues. It did get much better with doxycyline. She finished a couple of days ago. Still continues with drainage and feeling sluggish. She had her last IGG treatment on Tuesday. She does feel some better since then. She takes a decongesant every day and not helping. She has been tested for allergies and always negative. Zyrtec/claritin have not helped historically.     Review of Systems     Objective:   Physical Exam  Constitutional: She is oriented to person, place, and time. She appears well-developed and well-nourished.  HENT:  Head: Normocephalic and atraumatic.  Right Ear: External ear normal.  Left Ear: External ear normal.  Nose: Nose normal.  Mouth/Throat: Oropharynx is clear and moist. No oropharyngeal exudate.  TM's clear.   Maxillary tenderness over left maxillary sinuses.  Eyes: Conjunctivae are normal.  Neck: Normal range of motion. Neck supple.  Cardiovascular: Normal rate, regular rhythm and normal heart sounds.   Pulmonary/Chest: Effort normal and breath sounds normal. She has no wheezes.  Lymphadenopathy:    She has no cervical adenopathy.  Neurological: She is alert and oriented to person, place, and time.  Skin: Skin is warm and dry.  Psychiatric: She has a normal mood and affect. Her behavior is normal.          Assessment & Plan:  Sinusitis- I do not feel like need another antibiotic. Will give prednisone for the next couple of days to see if due to inflammation. Discussed with patient if still not improved then we need to order CT of sinuses. Encouraged pt to start nasal spray at home regularly.

## 2012-11-14 NOTE — Patient Instructions (Signed)
Start back on nasal spray daily 2 sprays. Start prednisone. Continue decongestant daily.

## 2012-11-25 ENCOUNTER — Encounter: Payer: Self-pay | Admitting: Family Medicine

## 2012-11-25 ENCOUNTER — Ambulatory Visit (INDEPENDENT_AMBULATORY_CARE_PROVIDER_SITE_OTHER): Payer: BC Managed Care – PPO | Admitting: Family Medicine

## 2012-11-25 VITALS — BP 104/66 | HR 85 | Ht 63.0 in | Wt 129.0 lb

## 2012-11-25 DIAGNOSIS — J019 Acute sinusitis, unspecified: Secondary | ICD-10-CM

## 2012-11-25 MED ORDER — LEVOFLOXACIN 500 MG PO TABS: 500.0000 mg | ORAL_TABLET | Freq: Every day | ORAL | Status: DC

## 2012-11-25 NOTE — Progress Notes (Signed)
  Subjective:    Patient ID: Diana Porter, female    DOB: 1955-09-02, 57 y.o.   MRN: [id number]  HPI Seen on 10/27/12 for sinusitis and given 10 days of doxy. She did feel better while on the antibiotic but got worse a few days after completeing the antibiotic.  Then seen again for persistant sxs and given prednisone only and says took the last dose today. Still getting a lot of green muscous from hre nose. Hx of sinus surgery.  Still slighty cough and will notice some brown mucous.  Has had some fever with it.  Bilateral maxillary sinus pain. Did feel a little better on steroid as well.    Review of Systems     Objective:   Physical Exam  Constitutional: She is oriented to person, place, and time. She appears well-developed and well-nourished.  HENT:  Head: Normocephalic and atraumatic.  Right Ear: External ear normal.  Left Ear: External ear normal.  Nose: Nose normal.  Mouth/Throat: Oropharynx is clear and moist.  TMs and canals are clear.   Eyes: Conjunctivae and EOM are normal. Pupils are equal, round, and reactive to light.  Neck: Neck supple. No thyromegaly present.  Cardiovascular: Normal rate, regular rhythm and normal heart sounds.   Pulmonary/Chest: Effort normal and breath sounds normal. She has no wheezes.  Lymphadenopathy:    She has no cervical adenopathy.  Neurological: She is alert and oriented to person, place, and time.  Skin: Skin is warm and dry.  Psychiatric: She has a normal mood and affect.          Assessment & Plan:  Acute sinusitis -Will tx with levaquin. If not bette in one week. Can continue symptomatic care. Call if suddenly getting worse.

## 2012-12-01 ENCOUNTER — Other Ambulatory Visit: Payer: Self-pay | Admitting: Family Medicine

## 2013-03-10 ENCOUNTER — Other Ambulatory Visit: Payer: Self-pay | Admitting: Family Medicine

## 2013-04-01 ENCOUNTER — Other Ambulatory Visit: Payer: Self-pay | Admitting: Family Medicine

## 2013-04-29 ENCOUNTER — Other Ambulatory Visit: Payer: Self-pay | Admitting: Family Medicine

## 2013-05-04 ENCOUNTER — Encounter: Payer: Self-pay | Admitting: Family Medicine

## 2013-05-04 ENCOUNTER — Ambulatory Visit (INDEPENDENT_AMBULATORY_CARE_PROVIDER_SITE_OTHER): Payer: BC Managed Care – PPO | Admitting: Family Medicine

## 2013-05-04 VITALS — BP 134/73 | HR 87 | Temp 97.8°F | Ht 63.0 in | Wt 141.0 lb

## 2013-05-04 DIAGNOSIS — R300 Dysuria: Secondary | ICD-10-CM

## 2013-05-04 DIAGNOSIS — R319 Hematuria, unspecified: Secondary | ICD-10-CM

## 2013-05-04 DIAGNOSIS — N760 Acute vaginitis: Secondary | ICD-10-CM

## 2013-05-04 DIAGNOSIS — F418 Other specified anxiety disorders: Secondary | ICD-10-CM

## 2013-05-04 DIAGNOSIS — Z13220 Encounter for screening for lipoid disorders: Secondary | ICD-10-CM

## 2013-05-04 DIAGNOSIS — E039 Hypothyroidism, unspecified: Secondary | ICD-10-CM

## 2013-05-04 DIAGNOSIS — F341 Dysthymic disorder: Secondary | ICD-10-CM

## 2013-05-04 LAB — POCT URINALYSIS DIPSTICK
Protein, UA: NEGATIVE
pH, UA: 5.5

## 2013-05-04 LAB — WET PREP FOR TRICH, YEAST, CLUE
Clue Cells Wet Prep HPF POC: NONE SEEN
Trich, Wet Prep: NONE SEEN
Yeast Wet Prep HPF POC: NONE SEEN

## 2013-05-04 MED ORDER — LEVOFLOXACIN 500 MG PO TABS: 500.0000 mg | ORAL_TABLET | Freq: Every day | ORAL | Status: DC

## 2013-05-04 NOTE — Progress Notes (Signed)
   Subjective:    Patient ID: Diana Porter, female    DOB: [birth year], 57 y.o.   MRN: [id number]  HPI Hypothyroid - ran out of thyroid medication. Tolerating it well.  On brand synthroid.  Has gained some weight lately.  Has had low energy as well.  Though was recently immobile after fractured 3 toes.    Depression w/ anxiety  - Taking citalopram regularly.  Has thought about seeing a psychiatrist when she goes to visit her mother. Her mother really likes her psychiatrist and has agreed to work her and when she goes up [place] to visit..   Some vaginal irritation for about 3-4 days. Thinks may have a yeat infection.  Some suprapubic discomfort.  No fever or chills.    Having problems with her sinuses lately.  No fever, chills, or sweats. Has had some green nasal d/c and pain behind the left eye.  No other symptoms of cough sore throat or ear pain or pressure. She has not been taking any cough cold medicines for it. Hx of recur4rent sinus infections though has done really well in the last year.  Review of Systems     Objective:   Physical Exam  Constitutional: She is oriented to person, place, and time. She appears well-developed and well-nourished.  HENT:  Head: Normocephalic and atraumatic.  Right Ear: External ear normal.  Left Ear: External ear normal.  Nose: Nose normal.  Mouth/Throat: Oropharynx is clear and moist.  Tenderness along the left brow ridge.  Eyes: Conjunctivae and EOM are normal. Pupils are equal, round, and reactive to light.  Neck: Neck supple. No thyromegaly present.  Cardiovascular: Normal rate, regular rhythm and normal heart sounds.   Pulmonary/Chest: Effort normal and breath sounds normal. She has no wheezes.  Lymphadenopathy:    She has no cervical adenopathy.  Neurological: She is alert and oriented to person, place, and time.  Skin: Skin is warm and dry.  Psychiatric: She has a normal mood and affect. Her behavior is normal.          Assessment & Plan:   Hypothyroid - Will check TSH, free T3 and free T4. Adjust dose as needed.   Depression with anxiety-will refeill the celexa.  Happy with current regimen. followup in 6 months.  Vaginitis - Will check UA and wet prep. We'll call with results once available.  Acute sinusitis - will treat with Levaquin per patient preference. Not significantly better in one week and please let me know. Can also had an IV that her nasal saline rinse as tolerated.

## 2013-05-05 LAB — COMPLETE METABOLIC PANEL WITH GFR
ALT: 21 U/L (ref 0–35)
AST: 22 U/L (ref 0–37)
CO2: 24 mEq/L (ref 19–32)
Calcium: 9.9 mg/dL (ref 8.4–10.5)
Chloride: 101 mEq/L (ref 96–112)
GFR, Est African American: 86 mL/min
Sodium: 137 mEq/L (ref 135–145)
Total Protein: 7.6 g/dL (ref 6.0–8.3)

## 2013-05-05 LAB — T4, FREE: Free T4: 1.44 ng/dL (ref 0.80–1.80)

## 2013-05-05 LAB — LIPID PANEL
HDL: 109 mg/dL (ref >39)
LDL Cholesterol: 159 mg/dL — ABNORMAL HIGH (ref 0–99)
VLDL: 22 mg/dL (ref 0–40)

## 2013-05-05 LAB — TSH: TSH: 0.726 u[IU]/mL (ref 0.350–4.500)

## 2013-05-07 LAB — URINE CULTURE

## 2013-05-11 ENCOUNTER — Telehealth: Payer: Self-pay | Admitting: *Deleted

## 2013-05-11 NOTE — Telephone Encounter (Signed)
Urine culture was pos for e coloi and it was sensitive to levaquin. Since still having sxs recommend repeat UA.  Mad appt for 11:15

## 2013-05-11 NOTE — Telephone Encounter (Signed)
Pt called and stated that the leviquin is not helping and is asking if something else can be called in she is in a lot of pain she uses [facility] on [address].Laureen Ochs, Viann Shove

## 2013-05-12 ENCOUNTER — Other Ambulatory Visit: Payer: Self-pay | Admitting: Family Medicine

## 2013-05-12 ENCOUNTER — Encounter: Payer: Self-pay | Admitting: Family Medicine

## 2013-05-12 ENCOUNTER — Ambulatory Visit (INDEPENDENT_AMBULATORY_CARE_PROVIDER_SITE_OTHER): Payer: BC Managed Care – PPO | Admitting: Family Medicine

## 2013-05-12 VITALS — BP 131/67 | HR 76 | Temp 99.2°F | Wt 146.1 lb

## 2013-05-12 DIAGNOSIS — J019 Acute sinusitis, unspecified: Secondary | ICD-10-CM

## 2013-05-12 DIAGNOSIS — R300 Dysuria: Secondary | ICD-10-CM

## 2013-05-12 DIAGNOSIS — R319 Hematuria, unspecified: Secondary | ICD-10-CM

## 2013-05-12 DIAGNOSIS — N390 Urinary tract infection, site not specified: Secondary | ICD-10-CM

## 2013-05-12 LAB — URINALYSIS, MICROSCOPIC ONLY
Bacteria, UA: NONE SEEN
Crystals: NONE SEEN
Squamous Epithelial / LPF: NONE SEEN

## 2013-05-12 LAB — URINALYSIS, ROUTINE W REFLEX MICROSCOPIC
Bilirubin Urine: NEGATIVE
Glucose, UA: NEGATIVE mg/dL
Hgb urine dipstick: NEGATIVE
Ketones, ur: NEGATIVE mg/dL
Protein, ur: NEGATIVE mg/dL
Specific Gravity, Urine: 1.013 (ref 1.005–1.030)
Urobilinogen, UA: 1 mg/dL (ref 0.0–1.0)

## 2013-05-12 LAB — POCT URINALYSIS DIPSTICK
Ketones, UA: NEGATIVE
Leukocytes, UA: NEGATIVE
Protein, UA: NEGATIVE
Urobilinogen, UA: 0.2
pH, UA: 7

## 2013-05-12 MED ORDER — ACETAMINOPHEN-CODEINE #3 300-30 MG PO TABS: 1.0000 | ORAL_TABLET | Freq: Four times a day (QID) | ORAL | Status: DC | PRN

## 2013-05-12 MED ORDER — PREDNISONE 50 MG PO TABS: 50.0000 mg | ORAL_TABLET | Freq: Every day | ORAL | Status: DC

## 2013-05-12 MED ORDER — CEPHALEXIN 500 MG PO CAPS: 500.0000 mg | ORAL_CAPSULE | Freq: Three times a day (TID) | ORAL | Status: DC

## 2013-05-12 NOTE — Progress Notes (Signed)
   Subjective:    Patient ID: Diana Porter, female    DOB: 04-14-1956, 57 y.o.   MRN: [id number]  HPI Has been on Levaquin for approximately 8 days for sinus infection as well as a UTI. She's having swelling on the right side of her face and significant facial pain. She is also having significant dysuria. Has been trying Azo-Standard but is not working. The urinary culture did grow Escherichia coli and it was sensitive to levofloxacin. But unfortunately she feels that her symptoms have not improved in fact they have worsened and she's having a lot of discomfort with urination. She also feels like her sinus symptoms have improved but is still seeing some right facial swelling. She still just doesn't feel well. She was asleep time yesterday to be with her mother on her birthday and her mother is having surgery today and really wanted to be there and has not felt well enough to go. She was a little upset today because she called yesterday and felt like we did not believe her that her symptoms have not improved.   Review of Systems     Objective:   Physical Exam  Constitutional: She is oriented to person, place, and time. She appears well-developed and well-nourished.  HENT:  Head: Normocephalic and atraumatic.  Right Ear: External ear normal.  Left Ear: External ear normal.  Nose: Nose normal.  Mouth/Throat: Oropharynx is clear and moist.  TMs and canals are clear. Thick white nasal discharge in the nares. Does have a little bit of swelling over the right side of her face. No erythema or bogginess.  Eyes: Conjunctivae and EOM are normal. Pupils are equal, round, and reactive to light.  Neck: Neck supple. No thyromegaly present.  Cardiovascular: Normal rate, regular rhythm and normal heart sounds.   Pulmonary/Chest: Effort normal and breath sounds normal. She has no wheezes.  Lymphadenopathy:    She has no cervical adenopathy.  Neurological: She is alert and oriented to person, place, and time.   Skin: Skin is warm and dry.  Psychiatric: She has a normal mood and affect.          Assessment & Plan:  Acute sinusitis-I tried to reassure her that it was not that we did not believe her which is due to come in for repeat evaluation since she was not improving on Levaquin and so she was still having persistent urinary symptoms while being on antibiotic but should have treated her urinary tract infection. I will go ahead and switch her to Keflex 3 times a day. She has a penicillin and sulfa allergy. She says she does not usually respond with azithromycin. We'll treat with prednisone for 5 days, help with inflammation and swelling of the sinus cavities.  UTI-will treat with Keflex. The initial urine culture was sensitive to this. We did repeat a urinalysis and we'll send a repeat culture today we'll call with results once available. We'll send her for something for pain. She says the Azo-Standard is not relieving her pain. Will send over a small quantity of Tylenol #3.

## 2013-05-13 ENCOUNTER — Telehealth: Payer: Self-pay | Admitting: *Deleted

## 2013-05-13 MED ORDER — NITROFURANTOIN MONOHYD MACRO 100 MG PO CAPS: 100.0000 mg | ORAL_CAPSULE | Freq: Two times a day (BID) | ORAL | Status: DC

## 2013-05-13 NOTE — Telephone Encounter (Signed)
No problem. I will add to her intolerance list and send over something new. Less than 1% of people who have a penicillin allergy have an intolerance to a cephalosporin.

## 2013-05-13 NOTE — Telephone Encounter (Signed)
Pt called and lvm stating that the keflex is making her sick she vomited both times she took the meds and she stated that she is allergic to PCN. Please advise.Diana Porter [place]

## 2013-05-13 NOTE — Telephone Encounter (Signed)
Called to inform pt of change to med. lvm informing her of this.Diana Porter

## 2013-05-26 ENCOUNTER — Other Ambulatory Visit: Payer: Self-pay

## 2013-05-26 MED ORDER — LEVOTHYROXINE SODIUM 88 MCG PO TABS: 88.0000 ug | ORAL_TABLET | Freq: Every day | ORAL | Status: DC

## 2013-05-26 NOTE — Telephone Encounter (Signed)
Sent prescription to [facility] pharmacy.

## 2013-06-24 ENCOUNTER — Other Ambulatory Visit: Payer: Self-pay | Admitting: Family Medicine

## 2013-08-03 ENCOUNTER — Encounter: Payer: Self-pay | Admitting: Family Medicine

## 2013-08-03 ENCOUNTER — Ambulatory Visit (INDEPENDENT_AMBULATORY_CARE_PROVIDER_SITE_OTHER): Payer: BC Managed Care – PPO

## 2013-08-03 ENCOUNTER — Other Ambulatory Visit: Payer: Self-pay | Admitting: Family Medicine

## 2013-08-03 ENCOUNTER — Ambulatory Visit (INDEPENDENT_AMBULATORY_CARE_PROVIDER_SITE_OTHER): Payer: BC Managed Care – PPO | Admitting: Family Medicine

## 2013-08-03 VITALS — BP 138/54 | HR 79 | Wt 139.0 lb

## 2013-08-03 DIAGNOSIS — R1110 Vomiting, unspecified: Secondary | ICD-10-CM

## 2013-08-03 DIAGNOSIS — R1906 Epigastric swelling, mass or lump: Secondary | ICD-10-CM

## 2013-08-03 DIAGNOSIS — K4090 Unilateral inguinal hernia, without obstruction or gangrene, not specified as recurrent: Secondary | ICD-10-CM

## 2013-08-03 DIAGNOSIS — S22009A Unspecified fracture of unspecified thoracic vertebra, initial encounter for closed fracture: Secondary | ICD-10-CM

## 2013-08-03 DIAGNOSIS — X58XXXA Exposure to other specified factors, initial encounter: Secondary | ICD-10-CM

## 2013-08-03 DIAGNOSIS — R1013 Epigastric pain: Secondary | ICD-10-CM

## 2013-08-03 DIAGNOSIS — N200 Calculus of kidney: Secondary | ICD-10-CM

## 2013-08-03 MED ORDER — IOHEXOL 300 MG/ML  SOLN: 100.0000 mL | Freq: Once | INTRAMUSCULAR | Status: AC | PRN

## 2013-08-03 NOTE — Progress Notes (Signed)
CC: Diana Porter is a 58 y.o. female is here for Abdominal Pain   Subjective: HPI:  Abdominal pain localized to the epigastric region present for the past week initially came on severe in severity now moderate to severe in severity. Nonradiating. Worse with pressing on the abdomen or wearing tightfitting clothing on the torso. Accompanied by vomiting which is now resolved for the past 5 days. Accompanied by decreased appetite and frequent belching. Pain is slightly improved with dicyclomine, worse with walking, nothing else is made better or worse. She's been seen by her GI physician which liver function and lipase was normal.  Abdominal ultrasound was significant only for polyp in the gallbladder per her report. States that she can feel and see a bulge in epigastric region that comes and goes throughout the day.  Denies fevers, chills, chest pain, shortness of breath, diarrhea, blood in vomit, coffee-ground emesis, nor abdominal pain elsewhere. Has started Nexium.  Review Of Systems Outlined In HPI  Past Medical History  Diagnosis Date  . Hypothyroidism   . Chronic UTI   . Epilepsy   . ADD (attention deficit disorder with hyperactivity)   . Anxiety     Past Surgical History  Procedure Laterality Date  . Appendectomy    . Breast lumpectomy      x 2  . Bladder surgery      x 2   . Nasal sinus surgery     Family History  Problem Relation Age of Onset  . Breast cancer Mother 82    metasticized   . Prostate cancer Father   . Heart failure Father   . Prostate cancer Brother 80  . Lymphoma Brother   . Skin cancer Brother     History   Social History  . Marital Status: Single    Spouse Name: N/A    Number of Children: N/A  . Years of Education: N/A   Occupational History  . Not on file.   Social History Main Topics  . Smoking status: Former Smoker -- 0.30 packs/day    Types: Cigarettes    Quit date: 05/28/2008  . Smokeless tobacco: Not on file  . Alcohol Use: Yes   Comment: 3-5 per wk  . Drug Use: No  . Sexual Activity: Not on file   Other Topics Concern  . Not on file   Social History Narrative   Used to do consulting work.  Unemployed. 2 caffeine drinks per day.      Objective: BP 138/54  Pulse 79  Wt 139 lb (63.05 kg)  General: Alert and Oriented, No Acute Distress HEENT: Pupils equal, round, reactive to light. Conjunctivae clear.  Moist membranes pharynx unremarkable Lungs: Clear comfortable work of breathing Cardiac: Regular rate and rhythm. Normal S1/S2.  No murmurs, rubs, nor gallops.   Abdomen: Normal bowel sounds, soft, there is guarding with palpation of the epigastric region. No palpable masses. No rebound. No palpable hernia or abdominal wall defect Extremities: No peripheral edema.  Strong peripheral pulses.  Mental Status: No depression, anxiety, nor agitation. Skin: Warm and dry.  Assessment & Plan: Diana Porter was seen today for abdominal pain.  Diagnoses and associated orders for this visit:  Abdominal or pelvic swelling, mass, or lump, epigastric - CT Abdomen Pelvis W Contrast; Future  Abdominal pain, epigastric - CT Abdomen Pelvis W Contrast; Future  Vomiting - CT Abdomen Pelvis W Contrast; Future    I do not think there is any need for blood work at this time based  on the results she got from Wednesday from her gastroenterologist.  Obtain CT scan to rule out hernia, pancreatitis, obstruction, voluvus.   Return if symptoms worsen or fail to improve.

## 2013-09-02 IMAGING — CT CT ABD-PELV W/ CM
2 of 5 series · 16 of 46 positions shown, 18 images · IV contrast (APPLIED)
Comparison: No comparison studies available.

CLINICAL DATA: Early satiety.  Abdominal distention.  Nausea.
History of appendectomy and recent colonoscopy

CT ABDOMEN AND PELVIS WITH CONTRAST
TECHNIQUE: Multidetector CT imaging of the abdomen and pelvis was
performed following the standard protocol during bolus
administration of intravenous contrast.
Contrast: 100mL OMNIPAQUE IOHEXOL 300 MG/ML  SOLN

[Series 3: abd/pelvis 5.0 b31f · axial · 0.86mm/px · z∈[-620,-200]mm · 13 of 94 slices shown, 15 images]
[im 5/94  soft-tissue]
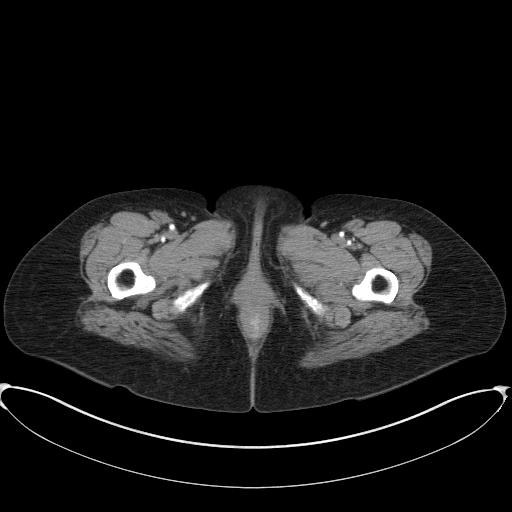
[im 5/94  bone]
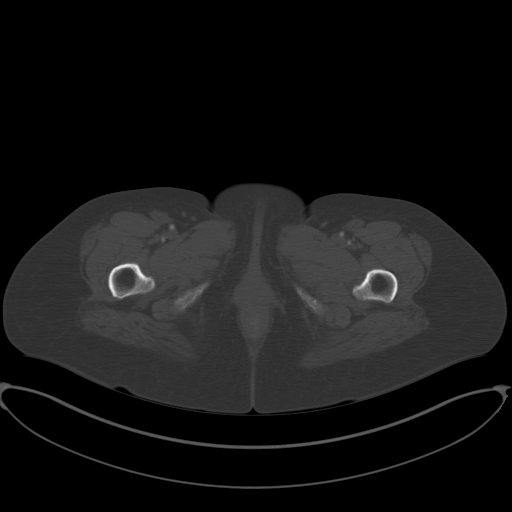
[im 14/94  soft-tissue]
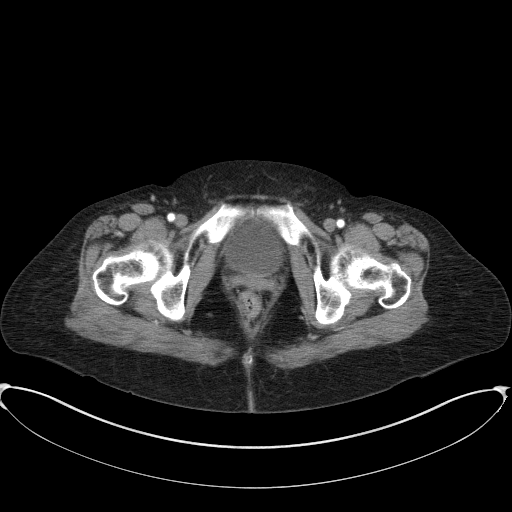
[im 19/94  soft-tissue]
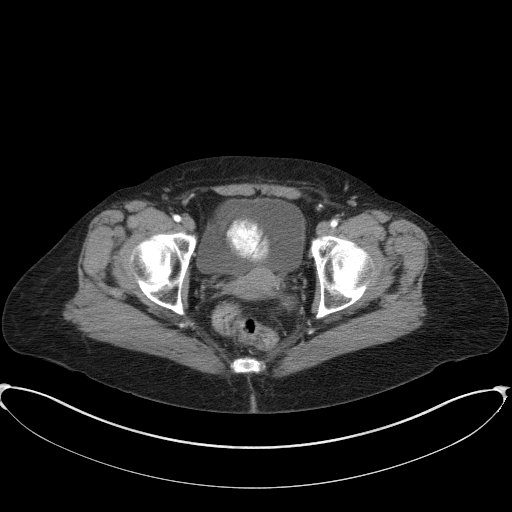
[im 28/94  soft-tissue]
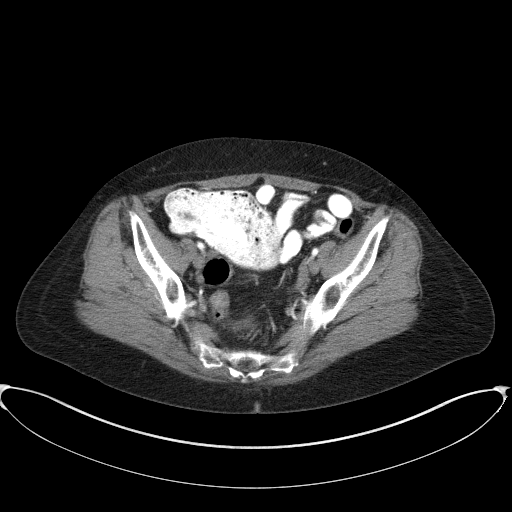
[im 33/94  soft-tissue]
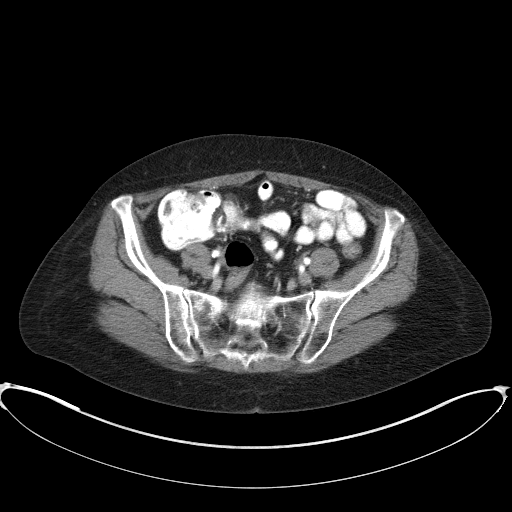
[im 42/94  soft-tissue]
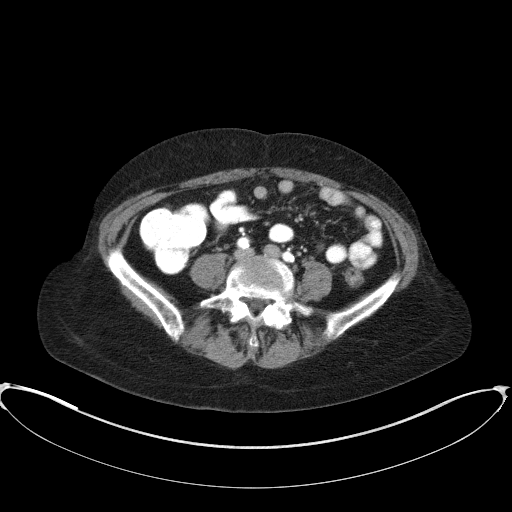
[im 47/94  soft-tissue]
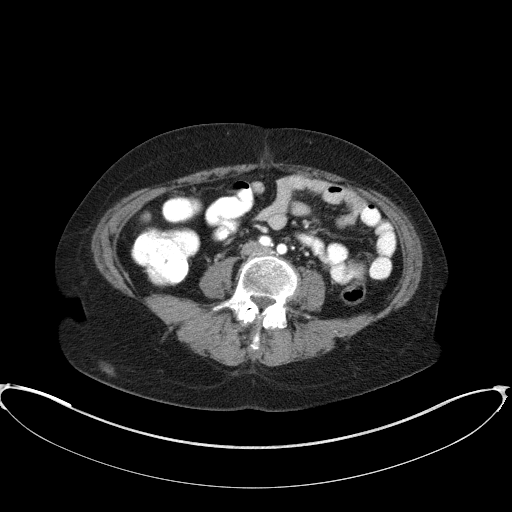
[im 52/94  soft-tissue]
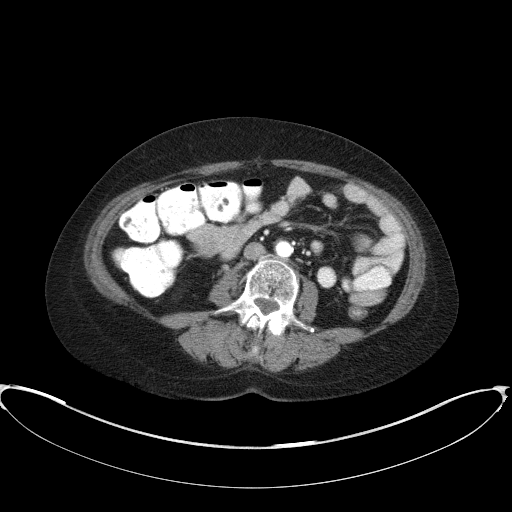
[im 61/94  soft-tissue]
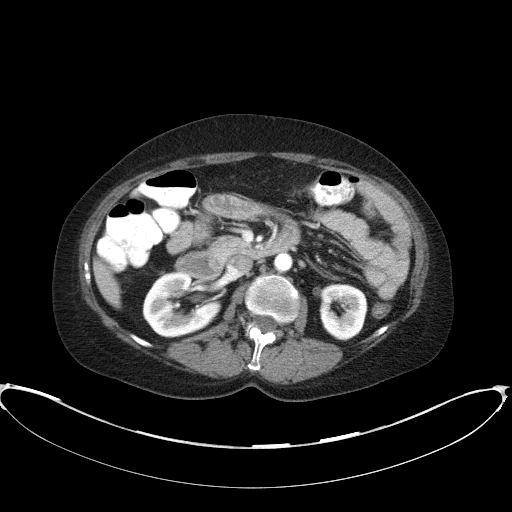
[im 61/94  bone]
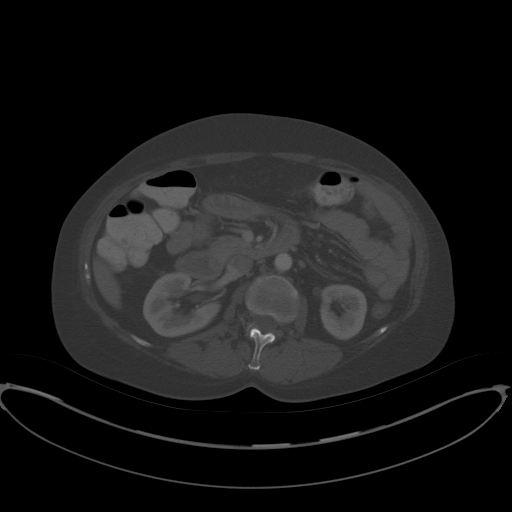
[im 66/94  soft-tissue]
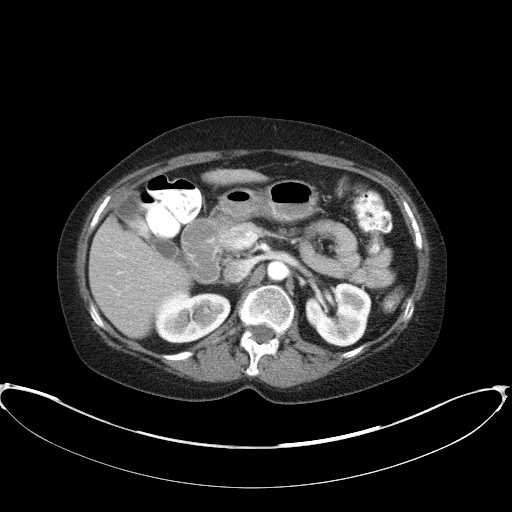
[im 75/94  soft-tissue]
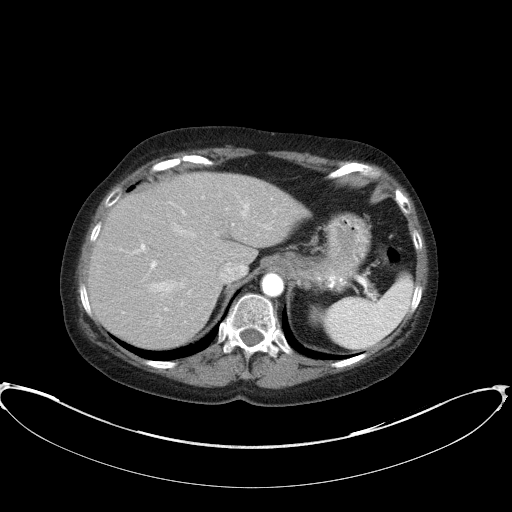
[im 80/94  soft-tissue]
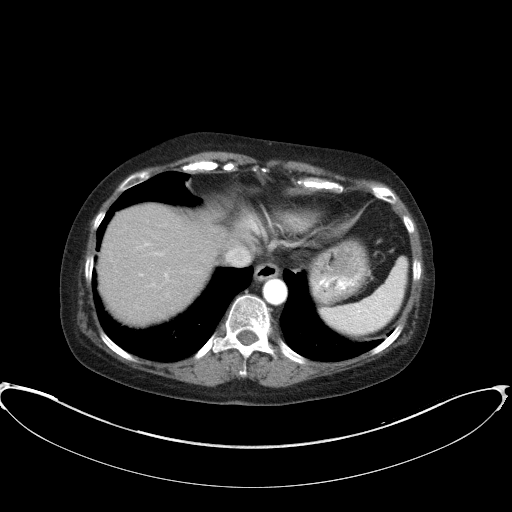
[im 89/94  soft-tissue]
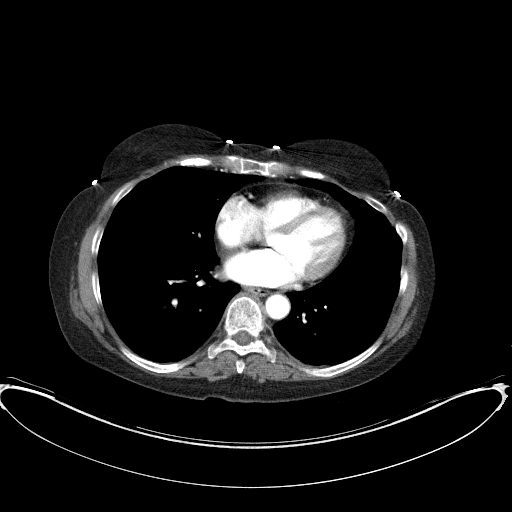

[Series 6: abd/pelvis 3.0 coronal · coronal · 0.82mm/px · 3 of 84 slices shown]
[im 28/84  soft-tissue]
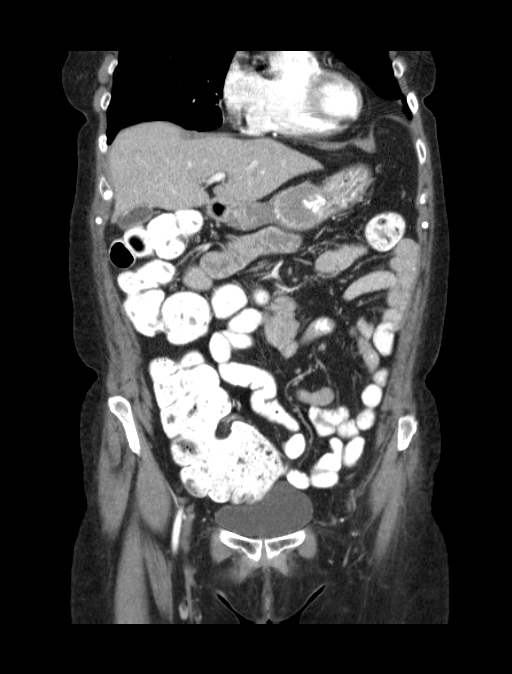
[im 37/84  soft-tissue]
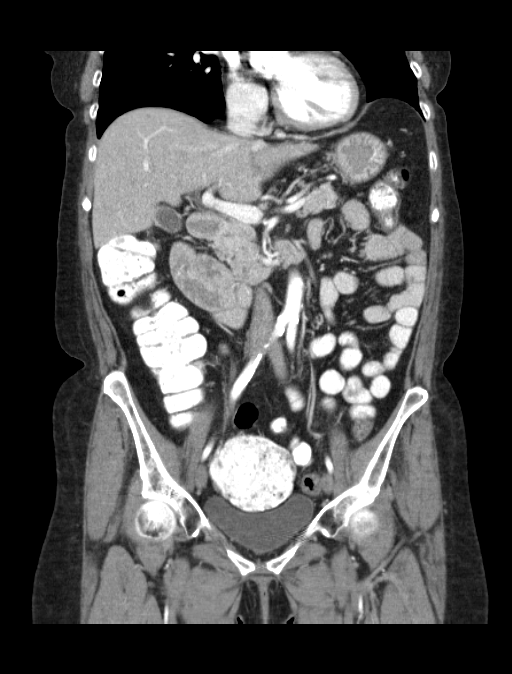
[im 47/84  soft-tissue]
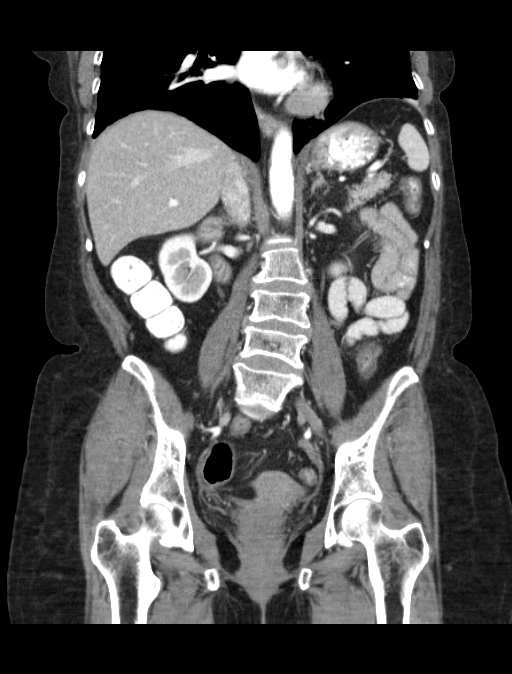

[16 of 46 positions shown; findings below may reference images not displayed]

FINDINGS: Focal low attenuation in the liver at the falciform
ligament is in a characteristic location for focal fatty
infiltration.  Liver otherwise shows some diffuse fatty
infiltrative changes.  Spleen is unremarkable.  The stomach,
duodenum, pancreas, gallbladder, and adrenal glands are
unremarkable. No intra or extrahepatic biliary duct dilatation.
Small lymph nodes are seen in the gastrohepatic ligament.

No abdominal aortic aneurysm.  No retroperitoneal lymphadenopathy.
No evidence for bowel obstruction.  No free fluid in the abdomen.

Imaging through the pelvis shows no pelvic sidewall
lymphadenopathy.  No intraperitoneal free fluid.  The uterus is
unremarkable.  No adnexal mass.  No substantial diverticular change
in the colon.  No evidence for colonic diverticulitis.  The
terminal ileum is normal. Nonvisualization of the appendix is
consistent with the reported history of appendectomy.

Small left inguinal hernia contains only fat.

Bone windows reveal no worrisome lytic or sclerotic osseous
lesions.
IMPRESSION: No acute findings in the abdomen or pelvis.  Specifically no
findings to explain the patient's symptoms of early satiety and
abdominal distention with nausea.

Diffuse fatty infiltration of the liver.

## 2013-09-18 ENCOUNTER — Telehealth: Payer: Self-pay | Admitting: *Deleted

## 2013-09-18 MED ORDER — ACETAMINOPHEN-CODEINE #3 300-30 MG PO TABS: 1.0000 | ORAL_TABLET | Freq: Four times a day (QID) | ORAL | Status: DC | PRN

## 2013-09-18 NOTE — Telephone Encounter (Signed)
Will refill her tylenol #3.  She will have to come by to physically pick up the prescriptions were not allowed to fax it in or call it in anymore.

## 2013-09-18 NOTE — Telephone Encounter (Signed)
Patient calls and LM on triage line stating she is having deep terrible pain in her abdomen still since March 3.  Has been [facility] who done some labs and told her it could be either pancreatitis or gallstones or gallbladder and she is having a nuclear study done next Thursday.  She called the on call doctor with [facility] last night because she is in so much pain and was told by them that they do prescribe narcotic pain meds and she would have to contact her PCP for this.  Pt is requesting something for pain

## 2013-09-18 NOTE — Telephone Encounter (Signed)
Pt notified. Julus Kelley, LPN  

## 2013-09-24 ENCOUNTER — Encounter: Payer: Self-pay | Admitting: Family Medicine

## 2013-09-24 DIAGNOSIS — K760 Fatty (change of) liver, not elsewhere classified: Secondary | ICD-10-CM | POA: Insufficient documentation

## 2013-10-01 ENCOUNTER — Ambulatory Visit (INDEPENDENT_AMBULATORY_CARE_PROVIDER_SITE_OTHER): Payer: BC Managed Care – PPO | Admitting: Family Medicine

## 2013-10-01 ENCOUNTER — Ambulatory Visit (INDEPENDENT_AMBULATORY_CARE_PROVIDER_SITE_OTHER): Payer: BC Managed Care – PPO

## 2013-10-01 ENCOUNTER — Encounter: Payer: Self-pay | Admitting: Family Medicine

## 2013-10-01 VITALS — BP 128/72 | HR 71 | Wt 145.0 lb

## 2013-10-01 DIAGNOSIS — N390 Urinary tract infection, site not specified: Secondary | ICD-10-CM

## 2013-10-01 DIAGNOSIS — M79609 Pain in unspecified limb: Secondary | ICD-10-CM

## 2013-10-01 DIAGNOSIS — M79672 Pain in left foot: Secondary | ICD-10-CM

## 2013-10-01 DIAGNOSIS — K824 Cholesterolosis of gallbladder: Secondary | ICD-10-CM

## 2013-10-01 MED ORDER — NITROFURANTOIN MONOHYD MACRO 100 MG PO CAPS: 100.0000 mg | ORAL_CAPSULE | Freq: Two times a day (BID) | ORAL | Status: DC

## 2013-10-01 NOTE — Addendum Note (Signed)
Addended by: Teddy Spike on: 10/01/2013 05:24 PM   Modules accepted: Orders

## 2013-10-01 NOTE — Progress Notes (Signed)
   Subjective:    Patient ID: Diana Porter, female    DOB: 29-Sep-1955, 58 y.o.   MRN: [id number]  HPI Had had severe epigastric pain for about 2 months.  Pt stated that she wonders if she should proceed with surgery if she has an active infection going on. her surgery is scheduled for 5/21. she has had [place] scan, CT of her abdomen done.  CT was negative, except for polyp on the gallbladder.   Had a recent UTI and took macrobid. Based on her symptoms she had some old nitroglycerin for and when home and decided to take on her own. She was not evaluated at a medical facility. She's not had any fevers chills or sweats with it. Just some dysuria. She is worried about something else going on.  Says after started the macrobid her epigastric pain completley resolved.    [address] and hit her 4th and 5th toes on the left foot and hit a piece a furniture on her foot. She has been wearing a boot that she had for hre other foot.  It has been painful to walk on it. She had a boot that she had used on her right foot when she had a sixpack sure and has been using that. It is a Teacher, adult education. She's not taking anything for pain control but did try icing it.  Review of Systems     Objective:   Physical Exam  Constitutional: She appears well-developed and well-nourished.  HENT:  Head: Normocephalic and atraumatic.  Musculoskeletal:  She has some bruising over the third fourth and fifth digits at the base on her left foot. She has significant swelling over the fourth and fifth digits. She's able to flex all to her fifth digit. Dorsal pedal pulses 2+. She has good capillary refill on all toes. Some pain with movement of the fourth and fifth digits.  Skin: Skin is warm and dry.  Psychiatric: She has a normal mood and affect. Her behavior is normal.          Assessment & Plan:  Gallbladder polyp and epigastric pain - I do recommend she go forward with having her gallbladder removed. It is because of the polyp  there is a slight increased risk of it developing into cancer at some point. Also think it could be causing her epigastric pain even though she has been getting some improvement in her discomfort with the nitrofurantoin which she is taking for UTI.  UTI-she's been on a different one for a couple of days. Recommend a urine specimen today with culture just to make sure that she's on the correct antibiotic to make sure that we get this cleared up before her surgery.  Trauma to fourth and fifth toes on her left foot-will get x-rays today for further evaluation to rule out fracture. She had a similar injury several years ago and had a stress fracture on her right foot. She's currently wearing the boot on her left foot and it does provide some comfort and relief.

## 2013-10-03 LAB — URINE CULTURE
Colony Count: NO GROWTH
Organism ID, Bacteria: NO GROWTH

## 2013-10-22 ENCOUNTER — Encounter: Payer: Self-pay | Admitting: Family Medicine

## 2013-11-09 ENCOUNTER — Encounter: Payer: Self-pay | Admitting: Physician Assistant

## 2013-11-09 ENCOUNTER — Ambulatory Visit (INDEPENDENT_AMBULATORY_CARE_PROVIDER_SITE_OTHER): Payer: BC Managed Care – PPO | Admitting: Physician Assistant

## 2013-11-09 VITALS — BP 129/66 | HR 86 | Ht 63.0 in | Wt 137.0 lb

## 2013-11-09 DIAGNOSIS — R1013 Epigastric pain: Secondary | ICD-10-CM

## 2013-11-09 DIAGNOSIS — R110 Nausea: Secondary | ICD-10-CM

## 2013-11-09 DIAGNOSIS — K824 Cholesterolosis of gallbladder: Secondary | ICD-10-CM

## 2013-11-09 DIAGNOSIS — R748 Abnormal levels of other serum enzymes: Secondary | ICD-10-CM

## 2013-11-09 NOTE — Progress Notes (Signed)
   Subjective:    Patient ID: Diana Porter, female    DOB: 08/05/1955, 58 y.o.   MRN: [id number]  HPI Patient is a 58 year old female who presents to the clinic to followup after cholecystectomy on 10/15/2013 by Dr. Charlane Ferretti. She went last week to her postop appointment with her surgeon. She reported that she went a week or so with improvement of belching, epigastric pain and symptoms after gallbladder removal. Last week her epigastric pain, belching and abdominal discomfort started back up. Her surgeon ordered lipase which was 70 and CMP which revealed AST at 425 and ALT at 325. She was immediately [facility] on [facility] for evaluation and seen by Servando Snare, PA-C. STAT cmP and AmYlase/lipase levels were obtained. I do not have those results but per pt he said they were declining by almost 100 units. She had MRCP done was and negative for any blockage per call back she received. I have not seen imaging or labs from [facility]. She continues to feel very nauseated and has intense pain at times. She is not aware that any other labs have been done to look for causes of elevated liver enzymes.  Review of Systems  All other systems reviewed and are negative.      Objective:   Physical Exam  Constitutional: She is oriented to person, place, and time. She appears well-developed and well-nourished.  HENT:  Head: Normocephalic and atraumatic.  Cardiovascular: Normal rate, regular rhythm and normal heart sounds.   Pulmonary/Chest: Effort normal and breath sounds normal.  Abdominal:  Patient reports pain with palpation over epigastric as well as left and right upper quadrant. Negative for rebound tenderness or guarding.  Neurological: She is alert and oriented to person, place, and time.  Skin: Skin is dry.  Psychiatric: She has a normal mood and affect. Her behavior is normal.          Assessment & Plan:  Elevated liver enzymes/abdominal pain/epigastric pain/nausea- I have not  seen any reports of work up and down on elevated liver enzymes except enzymes himself. Will get CBC, PT INR, hepatitis panel as well as H. pylori to make sure there is no ongoing gastritis-type infection. Per patient she has tried PPIs and they have not helped her symptoms. She's not happy with the service at [facility] she feels that they did not care about her. We will refer her to another GI specialist. Do think this is something we need to evaluate why they're so high right now. Amber did call [facility] to get a copy of MRCP as well as labs ordered for our records. Discussed red flags with pt. If pain continues or worsens please let us know. Offered anti-nausea pt did not want at this time.   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

## 2013-11-10 LAB — HEPATITIS PANEL, ACUTE
HCV AB: NEGATIVE
HEP B C IGM: NONREACTIVE
Hep A IgM: NONREACTIVE
Hepatitis B Surface Ag: NEGATIVE

## 2013-11-10 LAB — CBC WITH DIFFERENTIAL/PLATELET
BASOS PCT: 0 % (ref 0–1)
Basophils Absolute: 0 10*3/uL (ref 0.0–0.1)
EOS ABS: 0.3 10*3/uL (ref 0.0–0.7)
Eosinophils Relative: 4 % (ref 0–5)
HEMATOCRIT: 41.5 % (ref 36.0–46.0)
Hemoglobin: 14.1 g/dL (ref 12.0–15.0)
Lymphocytes Relative: 35 % (ref 12–46)
Lymphs Abs: 2.5 10*3/uL (ref 0.7–4.0)
MCH: 29.6 pg (ref 26.0–34.0)
MCHC: 34 g/dL (ref 30.0–36.0)
MCV: 87 fL (ref 78.0–100.0)
MONO ABS: 0.4 10*3/uL (ref 0.1–1.0)
MONOS PCT: 6 % (ref 3–12)
Neutro Abs: 3.9 10*3/uL (ref 1.7–7.7)
Neutrophils Relative %: 55 % (ref 43–77)
Platelets: 239 10*3/uL (ref 150–400)
RBC: 4.77 MIL/uL (ref 3.87–5.11)
RDW: 13.8 % (ref 11.5–15.5)
WBC: 7 10*3/uL (ref 4.0–10.5)

## 2013-11-10 LAB — PROTIME-INR
INR: 0.95 (ref <1.50)
Prothrombin Time: 12.6 seconds (ref 11.6–15.2)

## 2013-11-10 LAB — FERRITIN: Ferritin: 75 ng/mL (ref 10–291)

## 2013-11-10 LAB — H. PYLORI ANTIBODY, IGG

## 2013-12-07 ENCOUNTER — Other Ambulatory Visit: Payer: Self-pay | Admitting: *Deleted

## 2013-12-07 MED ORDER — LEVOTHYROXINE SODIUM 88 MCG PO TABS: ORAL_TABLET | ORAL | Status: AC

## 2014-05-07 ENCOUNTER — Encounter: Payer: Self-pay | Admitting: Family Medicine

## 2014-05-07 DIAGNOSIS — M87029 Idiopathic aseptic necrosis of unspecified humerus: Secondary | ICD-10-CM | POA: Insufficient documentation

## 2014-07-27 HISTORY — PX: TOTAL SHOULDER ARTHROPLASTY: SHX126

## 2014-09-28 ENCOUNTER — Encounter: Payer: Self-pay | Admitting: Family Medicine

## 2014-12-02 ENCOUNTER — Encounter: Payer: Self-pay | Admitting: Family Medicine
